# Patient Record
Sex: Female | Born: 1980 | Race: White | Hispanic: No | Marital: Married | Smoking: Former smoker
Health system: Southern US, Community
[De-identification: ages and names within clinical notes are randomized; demographics above are authoritative.]

## PROBLEM LIST (undated history)

## (undated) DIAGNOSIS — Z87898 Personal history of other specified conditions: Secondary | ICD-10-CM

## (undated) DIAGNOSIS — K3184 Gastroparesis: Secondary | ICD-10-CM

## (undated) DIAGNOSIS — R87629 Unspecified abnormal cytological findings in specimens from vagina: Secondary | ICD-10-CM

## (undated) DIAGNOSIS — K589 Irritable bowel syndrome without diarrhea: Secondary | ICD-10-CM

## (undated) DIAGNOSIS — K559 Vascular disorder of intestine, unspecified: Secondary | ICD-10-CM

## (undated) HISTORY — PX: COLPOSCOPY: SHX161

## (undated) HISTORY — DX: Vascular disorder of intestine, unspecified: K55.9

## (undated) HISTORY — PX: COLONOSCOPY: SHX174

## (undated) HISTORY — PX: WISDOM TOOTH EXTRACTION: SHX21

## (undated) HISTORY — PX: LYMPH NODE DISSECTION: SHX5087

## (undated) HISTORY — DX: Personal history of other specified conditions: Z87.898

## (undated) HISTORY — PX: DRUG INDUCED ENDOSCOPY: SHX6808

## (undated) HISTORY — DX: Irritable bowel syndrome, unspecified: K58.9

## (undated) HISTORY — DX: Gastroparesis: K31.84

---

## 2005-04-07 ENCOUNTER — Other Ambulatory Visit: Admission: RE | Admit: 2005-04-07 | Discharge: 2005-04-07 | Payer: Self-pay | Admitting: Obstetrics and Gynecology

## 2005-10-15 ENCOUNTER — Other Ambulatory Visit: Admission: RE | Admit: 2005-10-15 | Discharge: 2005-10-15 | Payer: Self-pay | Admitting: Obstetrics and Gynecology

## 2005-12-08 ENCOUNTER — Ambulatory Visit: Payer: Self-pay | Admitting: Internal Medicine

## 2006-04-02 ENCOUNTER — Encounter (INDEPENDENT_AMBULATORY_CARE_PROVIDER_SITE_OTHER): Payer: Self-pay | Admitting: Specialist

## 2006-04-02 ENCOUNTER — Ambulatory Visit: Payer: Self-pay | Admitting: Internal Medicine

## 2006-04-09 ENCOUNTER — Other Ambulatory Visit: Admission: RE | Admit: 2006-04-09 | Discharge: 2006-04-09 | Payer: Self-pay | Admitting: Obstetrics & Gynecology

## 2007-04-29 ENCOUNTER — Other Ambulatory Visit: Admission: RE | Admit: 2007-04-29 | Discharge: 2007-04-29 | Payer: Self-pay | Admitting: Obstetrics and Gynecology

## 2007-08-29 ENCOUNTER — Emergency Department (HOSPITAL_COMMUNITY): Admission: EM | Admit: 2007-08-29 | Discharge: 2007-08-29 | Payer: Self-pay | Admitting: Emergency Medicine

## 2007-08-30 ENCOUNTER — Ambulatory Visit: Payer: Self-pay | Admitting: Internal Medicine

## 2007-09-17 ENCOUNTER — Ambulatory Visit: Payer: Self-pay | Admitting: Internal Medicine

## 2007-09-17 LAB — CONVERTED CEMR LAB
BUN: 4 mg/dL — ABNORMAL LOW (ref 6–23)
CO2: 28 meq/L (ref 19–32)
Calcium: 9.2 mg/dL (ref 8.4–10.5)
Chloride: 106 meq/L (ref 96–112)
Creatinine, Ser: 0.6 mg/dL (ref 0.4–1.2)
GFR calc non Af Amer: 129 mL/min
Sed Rate: 7 mm/hr (ref 0–25)
Total Bilirubin: 0.5 mg/dL (ref 0.3–1.2)

## 2007-09-21 ENCOUNTER — Encounter: Payer: Self-pay | Admitting: Internal Medicine

## 2007-09-30 ENCOUNTER — Ambulatory Visit: Payer: Self-pay | Admitting: Internal Medicine

## 2007-09-30 ENCOUNTER — Encounter: Payer: Self-pay | Admitting: Internal Medicine

## 2007-10-06 ENCOUNTER — Encounter: Payer: Self-pay | Admitting: Internal Medicine

## 2007-10-06 ENCOUNTER — Ambulatory Visit: Payer: Self-pay | Admitting: Internal Medicine

## 2007-10-21 ENCOUNTER — Ambulatory Visit: Payer: Self-pay | Admitting: Cardiovascular Disease

## 2007-10-25 ENCOUNTER — Ambulatory Visit: Payer: Self-pay | Admitting: Internal Medicine

## 2007-10-28 ENCOUNTER — Ambulatory Visit (HOSPITAL_COMMUNITY): Admission: RE | Admit: 2007-10-28 | Discharge: 2007-10-28 | Payer: Self-pay | Admitting: Internal Medicine

## 2007-11-02 ENCOUNTER — Encounter: Payer: Self-pay | Admitting: Internal Medicine

## 2007-11-02 LAB — CONVERTED CEMR LAB: 5-HIAA, 24 Hr Urine: 3.8 mg/(24.h) (ref <=6.0)

## 2007-11-09 ENCOUNTER — Emergency Department (HOSPITAL_COMMUNITY): Admission: EM | Admit: 2007-11-09 | Discharge: 2007-11-09 | Payer: Self-pay | Admitting: Emergency Medicine

## 2008-01-11 DIAGNOSIS — K3184 Gastroparesis: Secondary | ICD-10-CM

## 2008-01-11 DIAGNOSIS — Z8719 Personal history of other diseases of the digestive system: Secondary | ICD-10-CM

## 2008-01-11 DIAGNOSIS — K589 Irritable bowel syndrome without diarrhea: Secondary | ICD-10-CM

## 2008-04-17 ENCOUNTER — Telehealth: Payer: Self-pay | Admitting: Internal Medicine

## 2008-09-18 ENCOUNTER — Telehealth: Payer: Self-pay | Admitting: Internal Medicine

## 2008-09-21 ENCOUNTER — Telehealth: Payer: Self-pay | Admitting: Internal Medicine

## 2008-10-26 ENCOUNTER — Telehealth: Payer: Self-pay | Admitting: Internal Medicine

## 2009-01-24 ENCOUNTER — Ambulatory Visit: Payer: Self-pay | Admitting: Internal Medicine

## 2009-02-09 ENCOUNTER — Ambulatory Visit (HOSPITAL_COMMUNITY): Admission: RE | Admit: 2009-02-09 | Discharge: 2009-02-09 | Payer: Self-pay | Admitting: Family Medicine

## 2009-10-08 ENCOUNTER — Telehealth: Payer: Self-pay | Admitting: Internal Medicine

## 2009-10-30 ENCOUNTER — Telehealth: Payer: Self-pay | Admitting: Internal Medicine

## 2009-11-06 ENCOUNTER — Encounter: Payer: Self-pay | Admitting: Internal Medicine

## 2011-04-01 NOTE — Assessment & Plan Note (Signed)
[facility]                                 ON-CALL NOTE   ALFRETTA, PINCH                      MRN:          [id number]  DATE:08/29/2007                            DOB:          01/16/1931    Lauren Goodwin called complaining of rectal bleeding. She has a history of  gastroparesis. She had painful crampy lower abdominal pain. This was  followed by straining and a bowel movement that was watery. She saw  small amounts of blood mixed with the stools. This happened on a second  occasion as well. The blood was minimal. She has no history of rectal  bleeding. Crampy abdominal pain is still present, though not as severe.   I instructed Lauren Goodwin to call back if she develops frank rectal  bleeding or melena. I told her that this was probably due to  hemorrhoidal bleeding and does not represent an acute severe GI bleed at  the present time.     Barbette Hair. Arlyce Dice, MD,FACG  Electronically Signed    RDK/MedQ  DD: 08/29/2007  DT: 08/29/2007  Job #: [id number]   cc:   Hedwig Morton. Juanda Chance, MD

## 2011-04-01 NOTE — Assessment & Plan Note (Signed)
[facility]                         GASTROENTEROLOGY OFFICE NOTE   Lauren Goodwin                    MRN:          [id number]  DATE:09/17/2007                            DOB:          [birth year]    Lauren Goodwin is a 30 year old white female, who has had intermittent  abdominal pain now for several years.  She has been fully evaluated in  another institution and moved down to [place] about two years ago.  She has been diagnosed with irritable bowel syndrome, but since we have  seen her, she has continued to have abdominal pain and, most recently,  was seen in the emergency room with acute rectal bleeding and abdominal  pain, abnormal CT of the abdomen, confirming presence of ischemic  colitis, involving left colon.  This was two weeks ago.  Patient was put  on bowel rest and antispasmodics.  She somewhat improved, but has still  recurrence of abdominal pain within two to three hours of eating,  sometimes immediately.  She has to eat only small amounts and she has  lost weight from 135 pounds on August 30, 2007, to currently 128.8  pounds today.  She is having diarrhea and constipation.  There has been  no fever and no further bleeding.   Her previous GI workup in [place] in 2003 included small bowel follow-  through, gastric emptying scan, upper endoscopy and colonoscopy.  She  was treated with MiraLax and Zelnorm, but never really did well on it.  She is currently followed at [facility] at Department of Gynecology  for dysplastic cervical biopsies.  She was referred by Rock Nephew.  The  patient's mother had history of endometriosis.  Patient herself has some  pain with her periods and has been on birth control pills for a period  of time.   MEDICATIONS:  1. Birth control pills  2. Levbid 0.375 mg one p.o. b.i.d.  3. Vicodin 5/500 p.r.n. abdominal pain.   PHYSICAL EXAM:  Blood pressure 100/64, pulse 72 and weight 128.8 pounds.  She appeared healthy, but thinner than on last exam.  LUNGS:  Clear to auscultation.  COR:  With normal S1, normal S2.  ABDOMEN:  Soft, relaxed, with tenderness in periumbilical area.  Left  and right lower quadrants are not tender and there was no distention.  Bowel sounds were somewhat hyperactive.  RECTAL EXAM:  With normal rectal tone, small amount of Hemoccult-  negative stool.   IMPRESSION:  Patient is a 30 year old white female with ongoing chronic  abdominal pain with acute exacerbation, recent evidence of ischemic  colitis on CT scan of the abdomen two weeks ago.  Patient continues to  have pain with eating and has resulting weight-loss.  I am concerned  about the severity of her symptoms and possible occult GI lesions that  we have not figured out yet.  For instance, Crohn's disease of the small  bowel, or possibly endometriosis, affecting her bowel.   PLAN:  1. Small bowel capsule endoscopy to rule out Crohn's disease of the      small bowel.  2. Continue Levbid 0.375 mg  p.o. b.i.d.  3. IBD markers.  4. Prescription for Vicodin 5/500 dispense #30 p.r.n. severe abdominal      pain.  5. I would like to ask Rock Nephew if she could assess patient as to      the possibility of endometriosis.     Hedwig Morton. Juanda Chance, MD  Electronically Signed    DMB/MedQ  DD: 09/17/2007  DT: 09/17/2007  Job #: [id number]   cc:   Kirk Ruths, M.D.  [place], [place] Rock Nephew, NP, [address]

## 2011-04-01 NOTE — Assessment & Plan Note (Signed)
[facility]                         GASTROENTEROLOGY OFFICE NOTE   HANAKO, TIPPING                    MRN:          [id number]  DATE:08/30/2007                            DOB:          [birth year]    Ms. Lemar Livings is a 30 year old white female seen today on an add-on basis  after being seen at the emergency room in [facility] last night  with acute abdominal pain and rectal bleeding.  Two days ago, on  Saturday, she was cleaning her house and drinking some wine, when she  developed crampy abdominal pain.  Her cramps were more severe than  usual.  We have seen her in the past for irritable bowel  syndrome/constipation and history of gastroparesis.  She was evaluated  for it in 2003 and had her last colonoscopy in May 2007.  The pain  persisted and in the evening she had a bowel movement with blood in it.  In the emergency room at [facility], her vital signs were  stable.  All her blood tests were normal but a CT scan of her abdomen  showed no specific thickening of the left colon toward the splenic  flexure consistent with ischemic colitis.  Since yesterday, she has  taken half of the Vicodin 5/500, every about 2-3 hours.  She is feeling  somewhat better.  The patient has stayed on clear liquids.  There has  been no fever and no further rectal bleeding.   MEDICATIONS:  1. MiraLax 17 grams 2-3 times a week.  She has not taken any.  2. Birth control pills daily for past 6 years.  3. Levbid 0.375 mg b.i.d.  The patient really has not taken it      recently.   PHYSICAL EXAMINATION:  VITAL SIGNS:  Blood pressure 118/60, pulse 60,  and weight 134 pounds.  Usual weight 135 pounds.  GENERAL:  She was alert and oriented in no distress.  SKIN:  Warm and dry.  HEENT:  Sclerae was nonicteric.  Oral cavity was normal.  NECK:  Supple.  No adenopathy.  LUNGS:  Clear to auscultation.  COR:  Quiet precordium.  Normal S1, normal S2.  ABDOMEN:   Soft with marked tenderness in the splenic flexure and the  left middle quadrant.  Normoactive bowel sounds.  No tympani.  Somewhat  hyperactive bowel sounds in the lower quadrant.  RECTAL:  With normal rectal tone.  No stool.  Mucus was heme negative.   IMPRESSION:  A 30 year old white female with ischemic colitis rather  stable.  The bleeding occurred more than 24 hours ago.  Since then the  patient has done somewhat better.  The etiology is not clear.  She has a  history of irritable bowel syndrome/constipation.  She is also on birth  control pills which could contribute to the ischemic bowel.  She took  ibuprofen but I doubt that would lead to ischemic colitis.  Other  possibilities is that she had some sort of low  blood flow through the  bowel either due to dehydration or vasodilatation from alcohol or  possibly due to baseline  low blood pressure.   PLAN:  1. I do not feel that she needs to be hospitalized because she is      already somewhat better.  2. She will stay on clear liquids for the next 24-48 hours and go      slowly to full liquids for the next 3-4 days, then to a regular      diet with low residue modifications.  3. Levbid 0.375 mg, take one every morning.  4. Continue to take hydrocodone as needed.  I suspect she may need it      for another 24-48 hours.  5. We discussed discontinuation of birth control pills.  She will see      her gynecologist, Shirlyn Goltz, from [facility] to      discuss different options for birth control.  6. I do not need to see her unless she continues to have problems.     Hedwig Morton. Juanda Chance, MD  Electronically Signed    DMB/MedQ  DD: 08/30/2007  DT: 08/30/2007  Job #: [id number]

## 2011-06-20 ENCOUNTER — Other Ambulatory Visit (HOSPITAL_COMMUNITY): Payer: Self-pay | Admitting: Internal Medicine

## 2011-06-20 ENCOUNTER — Ambulatory Visit (HOSPITAL_COMMUNITY)
Admission: RE | Admit: 2011-06-20 | Discharge: 2011-06-20 | Disposition: A | Discharge status: Home / Self Care | Payer: BC Managed Care – PPO | Source: Ambulatory Visit | Attending: Internal Medicine | Admitting: Internal Medicine

## 2011-06-20 DIAGNOSIS — R1931 Right upper quadrant abdominal rigidity: Secondary | ICD-10-CM

## 2011-06-20 DIAGNOSIS — R1011 Right upper quadrant pain: Secondary | ICD-10-CM | POA: Insufficient documentation

## 2011-08-22 LAB — URINALYSIS, ROUTINE W REFLEX MICROSCOPIC
Glucose, UA: NEGATIVE
pH: 7.5

## 2011-08-22 LAB — CBC
HCT: 37
Hemoglobin: 12.8
MCHC: 34.5
MCV: 87.9
RBC: 4.2
RDW: 12
WBC: 7.7

## 2011-08-22 LAB — BASIC METABOLIC PANEL
BUN: 4 — ABNORMAL LOW
Chloride: 103
Creatinine, Ser: 0.64
GFR calc non Af Amer: >60
Glucose, Bld: 114 — ABNORMAL HIGH
Sodium: 137

## 2011-08-22 LAB — HEPATIC FUNCTION PANEL
AST: 22
Albumin: 3.5
Alkaline Phosphatase: 34 — ABNORMAL LOW
Indirect Bilirubin: 0.7
Total Protein: 6.5

## 2011-08-22 LAB — DIFFERENTIAL
Basophils Relative: 0
Neutrophils Relative %: 71

## 2011-08-28 LAB — PREGNANCY, URINE: Preg Test, Ur: NEGATIVE

## 2011-08-28 LAB — COMPREHENSIVE METABOLIC PANEL
Alkaline Phosphatase: 26 — ABNORMAL LOW
BUN: 8
CO2: 24
Calcium: 9.1
GFR calc Af Amer: >60
GFR calc non Af Amer: >60
Glucose, Bld: 112 — ABNORMAL HIGH
Potassium: 3.9
Sodium: 136
Total Bilirubin: 0.6
Total Protein: 6.9

## 2011-08-28 LAB — URINALYSIS, ROUTINE W REFLEX MICROSCOPIC
Hgb urine dipstick: NEGATIVE
Ketones, ur: 40 — AB
Protein, ur: NEGATIVE
Specific Gravity, Urine: 1.015
Urobilinogen, UA: 0.2
pH: 5

## 2011-08-28 LAB — CBC
MCHC: 34.7
MCV: 86.4
WBC: 8.6

## 2011-08-28 LAB — LIPASE, BLOOD: Lipase: 18

## 2011-08-28 LAB — DIFFERENTIAL
Lymphocytes Relative: 14
Neutro Abs: 6.9

## 2011-10-21 ENCOUNTER — Encounter: Payer: Self-pay | Admitting: *Deleted

## 2011-10-27 ENCOUNTER — Ambulatory Visit (INDEPENDENT_AMBULATORY_CARE_PROVIDER_SITE_OTHER): Payer: BC Managed Care – PPO | Admitting: Internal Medicine

## 2011-10-27 ENCOUNTER — Encounter: Payer: Self-pay | Admitting: Internal Medicine

## 2011-10-27 VITALS — BP 100/68 | HR 60 | Ht 65.0 in | Wt 140.0 lb

## 2011-10-27 DIAGNOSIS — K589 Irritable bowel syndrome without diarrhea: Secondary | ICD-10-CM

## 2011-10-27 MED ORDER — HYDROCODONE-ACETAMINOPHEN 5-500 MG PO TABS: 1.0000 | ORAL_TABLET | Freq: Four times a day (QID) | ORAL | Status: DC | PRN

## 2011-10-27 NOTE — Patient Instructions (Signed)
We have sent the following medications to your pharmacy for you to pick up at your convenience: Hydrocodone CC: Dr Karleen Hampshire

## 2011-10-27 NOTE — Progress Notes (Signed)
Lauren Goodwin Apr 13, 1981 MRN [id number]    History of Present Illness:  This is a 30 year old white female with irritable bowel syndrome who underwent an extensive GI workup in 2008 for acute abdominal pain and ischemic colitis on a colonoscopy in 2008. The last office visit was in March 2010. Her abdominal pain has stabilized to where the attacks occur about once a month and usually are relieved with levbid and hydrocodone. She needs a refill on both prescriptions. Her small bowel capsule endoscopy in 2008 showed a segment of ulcerated mucosa and the small bowel. The capsule was retained at that point. A CT scan of the abdomen showed a thickened left colon consistent with ischemic colitis.   Past Medical History  Diagnosis Date  . Colitis, ischemic   . Gastroparesis   . IBS (irritable bowel syndrome)    Past Surgical History  Procedure Date  . Lymph node dissection     reports that she has quit smoking. She has never used smokeless tobacco. She reports that she drinks alcohol. She reports that she does not use illicit drugs. family history includes Heart disease in an unspecified family member.  There is no history of Colon cancer. No Known Allergies      Review of Systems: Denies heartburn chest pain shortness of breath positive for abdominal pain as described above  The remainder of the 10 point ROS is negative except as outlined in H&P   Physical Exam: General appearance  Well developed, in no distress. Eyes- non icteric. HEENT nontraumatic, normocephalic. Mouth no lesions, tongue papillated, no cheilosis. Neck supple without adenopathy, thyroid not enlarged, no carotid bruits, no JVD. Lungs Clear to auscultation bilaterally. Cor normal S1, normal S2, regular rhythm, no murmur,  quiet precordium. Abdomen: Soft with normal active bowel sounds. Mild tenderness in right lower quadrant of the cecum. No mass or fullness. The rest of the abdominal exam was  unremarkable. Rectal: Noted Extremities no pedal edema. Skin no lesions. Neurological alert and oriented x 3. Psychological normal mood and affect.  Assessment and Plan:  Problem #1 Intermittent abdominal pain. Patient is status post extensive evaluation. Her symptoms are consistent with irritable bowel syndrome although Crohn's disease could never be ruled out. She has been able to gain some weight back and has responded to oral antispasmodics. Her disease has not progressed. We will obtain her lab results from Lauren Goodwin which were done recently. She is trying to get pregnant and will let us know if there are any GI problems during her pregnancy.   10/27/2011 Lauren Goodwin

## 2012-07-12 ENCOUNTER — Other Ambulatory Visit: Payer: Self-pay | Admitting: Internal Medicine

## 2012-07-15 ENCOUNTER — Telehealth: Payer: Self-pay | Admitting: Internal Medicine

## 2012-07-15 MED ORDER — HYDROCODONE-ACETAMINOPHEN 5-500 MG PO TABS: 1.0000 | ORAL_TABLET | Freq: Four times a day (QID) | ORAL | Status: AC | PRN

## 2012-07-15 NOTE — Telephone Encounter (Signed)
1 month rx sent. 

## 2014-09-20 ENCOUNTER — Other Ambulatory Visit: Payer: Self-pay | Admitting: Obstetrics and Gynecology

## 2014-09-21 LAB — CYTOLOGY - PAP

## 2014-09-27 ENCOUNTER — Telehealth: Payer: Self-pay | Admitting: Internal Medicine

## 2014-10-26 NOTE — Telephone Encounter (Signed)
A user error has taken place.

## 2014-11-15 ENCOUNTER — Ambulatory Visit (INDEPENDENT_AMBULATORY_CARE_PROVIDER_SITE_OTHER): Payer: Managed Care, Other (non HMO) | Admitting: Internal Medicine

## 2014-11-15 ENCOUNTER — Encounter: Payer: Self-pay | Admitting: Internal Medicine

## 2014-11-15 VITALS — BP 110/60 | HR 64 | Ht 65.0 in | Wt 139.0 lb

## 2014-11-15 DIAGNOSIS — K589 Irritable bowel syndrome without diarrhea: Secondary | ICD-10-CM

## 2014-11-15 MED ORDER — HYOSCYAMINE SULFATE ER 0.375 MG PO TB12: 0.3750 mg | ORAL_TABLET | Freq: Two times a day (BID) | ORAL | Status: DC

## 2014-11-15 MED ORDER — HYDROCODONE-ACETAMINOPHEN 5-325 MG PO TABS: 1.0000 | ORAL_TABLET | Freq: Four times a day (QID) | ORAL | Status: DC | PRN

## 2014-11-15 NOTE — Patient Instructions (Signed)
We have sent the following medications to your pharmacy for you to pick up at your convenience: Levbid  We have given you a prescription for hydrocodone to take to your pharmacy.  Please follow up with Dr Juanda ChanceBrodie in 2 years.  Cc:Dr Karleen HampshireWilliam McGough

## 2014-11-15 NOTE — Progress Notes (Signed)
Lauren Goodwin 12/29/1980 [id number]  Note: This dictation was prepared with Dragon digital system. Any transcriptional errors that result from this procedure are unintentional.   History of Present Illness:  This is a 33 year old white female with chronic intermittent abdominal pain and history of ischemic colitis in March 2010. Her last appointment was in December 2012 for irritable bowel syndrome. She underwent extensive GI evaluation in 2008 which included small bowel capsule endoscopy, which revealed ulcerated mucosa in the small bowel. A CT scan of the abdomen at that time showed thickening of the left colon. A colonoscopy in 2003 showed nonspecific terminal ileitis. A repeat in 2007 was normal. An upper endoscopy in 2008 was normal. His most recent CT scan of the abdomen in August 2012 was normal. She comes today to refill Levbid and hydrocodone. She has had only 3-4 attacks in the last year. The attacks usually start with lower abdominal pain cramps followed by diarrhea. She has learned how to handle it by taking one hydrocodone, Levbid and  Bowl rest. The attacks seem to be brought on by stress. She has learnt to modify her diet to avoid fried foods and large heavy meals.    Past Medical History  Diagnosis Date  . Colitis, ischemic   . Gastroparesis   . IBS (irritable bowel syndrome)     Past Surgical History  Procedure Laterality Date  . Lymph node dissection      No Known Allergies  Family history and social history have been reviewed.  Review of Systems: Denies rectal bleeding. Mild constipation. Occasional vomiting  The remainder of the 10 point ROS is negative except as outlined in the H&P  Physical Exam: General Appearance Well developed, in no distress Eyes  Non icteric  HEENT  Non traumatic, normocephalic  Mouth No lesion, tongue papillated, no cheilosis Neck Supple without adenopathy, thyroid not enlarged, no carotid bruits, no JVD Lungs Clear to  auscultation bilaterally COR Normal S1, normal S2, regular rhythm, no murmur, quiet precordium Abdomen soft nontender abdomen with normoactive bowel sounds. No distention Rectal not done Extremities  No pedal edema Skin No lesions Neurological Alert and oriented x 3 Psychological Normal mood and affect  Assessment and Plan:   Problem #301 33 year old white female with irritable bowel syndrome who has episodes  of abdominal pain consistent with gastrocolic reflex. She had one episode of ischemic colitis in 2008.. Inflammatory bowel disease is not likely. She is doing reasonably well taking Levbid and on a rare occasion Vicodin. We will refill her medications. We will see her when necessary.    Lauren SarDora Kamora Goodwin 11/15/2014

## 2014-12-14 ENCOUNTER — Other Ambulatory Visit (HOSPITAL_COMMUNITY): Payer: Self-pay | Admitting: Physician Assistant

## 2014-12-14 DIAGNOSIS — R0789 Other chest pain: Secondary | ICD-10-CM

## 2014-12-14 DIAGNOSIS — M940 Chondrocostal junction syndrome [Tietze]: Secondary | ICD-10-CM

## 2014-12-20 ENCOUNTER — Other Ambulatory Visit (HOSPITAL_COMMUNITY): Payer: Self-pay | Admitting: Physician Assistant

## 2014-12-20 ENCOUNTER — Ambulatory Visit (HOSPITAL_COMMUNITY)
Admission: RE | Admit: 2014-12-20 | Discharge: 2014-12-20 | Disposition: A | Discharge status: Home / Self Care | Payer: Managed Care, Other (non HMO) | Source: Ambulatory Visit | Attending: Physician Assistant | Admitting: Physician Assistant

## 2014-12-20 DIAGNOSIS — R0789 Other chest pain: Secondary | ICD-10-CM

## 2014-12-20 DIAGNOSIS — M940 Chondrocostal junction syndrome [Tietze]: Secondary | ICD-10-CM

## 2014-12-20 DIAGNOSIS — R079 Chest pain, unspecified: Secondary | ICD-10-CM | POA: Insufficient documentation

## 2015-03-15 ENCOUNTER — Telehealth: Payer: Self-pay | Admitting: Internal Medicine

## 2015-03-15 NOTE — Telephone Encounter (Signed)
Received [facility] sent to Dr. Juanda ChanceBrodie. 03/15/15/ss

## 2015-04-24 ENCOUNTER — Ambulatory Visit (INDEPENDENT_AMBULATORY_CARE_PROVIDER_SITE_OTHER): Payer: Managed Care, Other (non HMO) | Admitting: Internal Medicine

## 2015-04-24 ENCOUNTER — Encounter: Payer: Self-pay | Admitting: Internal Medicine

## 2015-04-24 VITALS — BP 100/68 | HR 60 | Ht 65.0 in | Wt 137.2 lb

## 2015-04-24 DIAGNOSIS — R079 Chest pain, unspecified: Secondary | ICD-10-CM | POA: Diagnosis not present

## 2015-04-24 NOTE — Patient Instructions (Addendum)
Your physician has requested that you go to the basement for the following lab work before leaving today:H. Pylori stool antigen.   You have been scheduled for a Barium Esophogram at [facility] (1st floor of the hospital) on 05/02/15 at 10:30am. Please arrive 15 minutes prior to your appointment for registration. Make certain not to have anything to eat or drink 4 hours prior to your test. If you need to reschedule for any reason, please contact radiology at [phone] to do so. __________________________________________________________________ A barium swallow is an examination that concentrates on views of the esophagus. This tends to be a double contrast exam (barium and two liquids which, when combined, create a gas to distend the wall of the oesophagus) or single contrast (non-ionic iodine based). The study is usually tailored to your symptoms so a good history is essential. Attention is paid during the study to the form, structure and configuration of the esophagus, looking for functional disorders (such as aspiration, dysphagia, achalasia, motility and reflux) EXAMINATION You may be asked to change into a gown, depending on the type of swallow being performed. A radiologist and radiographer will perform the procedure. The radiologist will advise you of the type of contrast selected for your procedure and direct you during the exam. You will be asked to stand, sit or lie in several different positions and to hold a small amount of fluid in your mouth before being asked to swallow while the imaging is performed .In some instances you may be asked to swallow barium coated marshmallows to assess the motility of a solid food bolus. The exam can be recorded as a digital or video fluoroscopy procedure. POST PROCEDURE It will take 1-2 days for the barium to pass through your system. To facilitate this, it is important, unless otherwise directed, to increase your fluids for the next 24-48hrs and  to resume your normal diet.  This test typically takes about 30 minutes to perform. __________________________________________________________________________________ Dwyane LuoBen Mann PA

## 2015-04-24 NOTE — Progress Notes (Signed)
Lauren Goodwin 03/05/1981 [id number]  Note: This dictation was prepared with Dragon digital system. Any transcriptional errors that result from this procedure are unintentional.   History of Present Illness: This is a 34 year-old white female with history of irritable bowel syndrome and question of inflammatory bowel disease. She is here today top evaluate  noncardiac chest pain which started last 4 2015 and was quite intense in the winter during January and February 2016. It has subsided significantly at this point. It occurs randomly in no relationship to position breathing eating or movement. It was completely relieved by ibuprofen 800 mg 3 times a day but unfortunately the upper fold often had gastric intolerance she had to stop it. She was then put on omeprazole 20 mg daily which seemed to help some but she developed urinary tract infection which she associated with the omeprazole. . Pylori antibody was positive so she was scheduled for  [employer] breath test but had to be off PPI's before being able to schedule it.. She is currently not taking any medication and the pain is  very weak and lasts only several seconds at a time She says it is down to 30%.. She is quite concerned about it and asks  to have it  evaluated. She had an extensive GI workup in 2008 and 2010 for irritable bowel syndrome/ suspected inflammatory bowel disease. Most recent CT scan of the abdomen in August 2012 was normal. Upper endoscopy in 2008 was normal.     Past Medical History  Diagnosis Date  . Colitis, ischemic   . Gastroparesis   . IBS (irritable bowel syndrome)     Past Surgical History  Procedure Laterality Date  . Lymph node dissection      No Known Allergies  Family history and social history have been reviewed.  Review of Systems: Chest pain as described above  The remainder of the 10 point ROS is negative except as outlined in the H&P  Physical Exam: General Appearance Well developed, in no  distress Eyes  Non icteric  HEENT  Non traumatic, normocephalic  Mouth No lesion, tongue papillated, no cheilosis Neck Supple without adenopathy, thyroid not enlarged, no carotid bruits, no JVD Lungs Clear to auscultation bilaterally, costochondral junctions are not tender, no rub on inspiration COR Normal S1, normal S2, regular rhythm, no murmur, quiet precordium Abdomen soft, nontender. No distention. Rectal not done Extremities  No pedal edema Skin No lesions Neurological Alert and oriented x 3 Psychological Normal mood and affect  Assessment and Plan:   34 year old white female with the vague substernal chest pain which has subsided significantly since its onset last fall 2015. She has tried anti-inflammatory agents as well as acid reducing agents. There is no associated dysphagia. I think she has esophageal spasm. We will proceed with barium esophagram and  if negative we will proceed with upper endoscopy. We will be checking stool for H. Pylori in place of a breath test. Depending on the results of the barium study we may do upper endoscopy. We will start an anti- spasmodic  Levsin sublingually 0.125. She is currently on Levbid.Marland Kitchen.375 when necessary irritable bowel syndrome    Lauren Goodwin 04/24/2015

## 2015-04-30 ENCOUNTER — Other Ambulatory Visit: Payer: Managed Care, Other (non HMO)

## 2015-04-30 DIAGNOSIS — R079 Chest pain, unspecified: Secondary | ICD-10-CM

## 2015-05-01 LAB — HELICOBACTER PYLORI  SPECIAL ANTIGEN: H. PYLORI Antigen: NEGATIVE

## 2015-05-02 ENCOUNTER — Other Ambulatory Visit: Payer: Self-pay | Admitting: *Deleted

## 2015-05-02 ENCOUNTER — Ambulatory Visit (HOSPITAL_COMMUNITY)
Admission: RE | Admit: 2015-05-02 | Discharge: 2015-05-02 | Disposition: A | Discharge status: Home / Self Care | Payer: Managed Care, Other (non HMO) | Source: Ambulatory Visit | Attending: Internal Medicine | Admitting: Internal Medicine

## 2015-05-02 DIAGNOSIS — R079 Chest pain, unspecified: Secondary | ICD-10-CM | POA: Insufficient documentation

## 2015-05-07 ENCOUNTER — Ambulatory Visit (AMBULATORY_SURGERY_CENTER): Payer: Self-pay | Admitting: *Deleted

## 2015-05-07 VITALS — Ht 66.0 in | Wt 137.2 lb

## 2015-05-07 DIAGNOSIS — R079 Chest pain, unspecified: Secondary | ICD-10-CM

## 2015-05-07 NOTE — Progress Notes (Signed)
No home 02 use No issues with past sedation No egg or soy allergy No diet pills emmi declined

## 2015-05-09 ENCOUNTER — Encounter: Payer: Self-pay | Admitting: Internal Medicine

## 2015-05-09 ENCOUNTER — Ambulatory Visit (AMBULATORY_SURGERY_CENTER): Payer: Managed Care, Other (non HMO) | Admitting: Internal Medicine

## 2015-05-09 VITALS — BP 112/70 | HR 59 | Temp 98.5°F | Resp 30 | Ht 65.0 in | Wt 137.0 lb

## 2015-05-09 DIAGNOSIS — R079 Chest pain, unspecified: Secondary | ICD-10-CM | POA: Diagnosis present

## 2015-05-09 DIAGNOSIS — R0789 Other chest pain: Secondary | ICD-10-CM

## 2015-05-09 DIAGNOSIS — K2950 Unspecified chronic gastritis without bleeding: Secondary | ICD-10-CM | POA: Diagnosis not present

## 2015-05-09 MED ORDER — HYOSCYAMINE SULFATE 0.125 MG SL SUBL: 0.1250 mg | SUBLINGUAL_TABLET | SUBLINGUAL | Status: DC | PRN

## 2015-05-09 MED ORDER — SODIUM CHLORIDE 0.9 % IV SOLN: 500.0000 mL | INTRAVENOUS | Status: DC

## 2015-05-09 NOTE — Progress Notes (Signed)
Called to room to assist during endoscopic procedure.  Patient ID and intended procedure confirmed with present staff. Received instructions for my participation in the procedure from the performing physician.  

## 2015-05-09 NOTE — Patient Instructions (Signed)
YOU HAD AN ENDOSCOPIC PROCEDURE TODAY AT THE [facility]:   Refer to the procedure report that was given to you for any specific questions about what was found during the examination.  If the procedure report does not answer your questions, please call your gastroenterologist to clarify.  If you requested that your care partner not be given the details of your procedure findings, then the procedure report has been included in a sealed envelope for you to review at your convenience later.  YOU SHOULD EXPECT: Some feelings of bloating in the abdomen. Passage of more gas than usual.  Walking can help get rid of the air that was put into your GI tract during the procedure and reduce the bloating. If you had a lower endoscopy (such as a colonoscopy or flexible sigmoidoscopy) you may notice spotting of blood in your stool or on the toilet paper. If you underwent a bowel prep for your procedure, you may not have a normal bowel movement for a few days.  Please Note:  You might notice some irritation and congestion in your nose or some drainage.  This is from the oxygen used during your procedure.  There is no need for concern and it should clear up in a day or so.  SYMPTOMS TO REPORT IMMEDIATELY:   Following lower endoscopy (colonoscopy or flexible sigmoidoscopy):  Excessive amounts of blood in the stool  Significant tenderness or worsening of abdominal pains  Swelling of the abdomen that is new, acute  Fever of 100F or higher   Following upper endoscopy (EGD)  Vomiting of blood or coffee ground material  New chest pain or pain under the shoulder blades  Painful or persistently difficult swallowing  New shortness of breath  Fever of 100F or higher  Black, tarry-looking stools  For urgent or emergent issues, a gastroenterologist can be reached at any hour by calling [phone].   DIET: Your first meal following the procedure should be a small meal and then it is ok to progress to  your normal diet. Heavy or fried foods are harder to digest and may make you feel nauseous or bloated.  Likewise, meals heavy in dairy and vegetables can increase bloating.  Drink plenty of fluids but you should avoid alcoholic beverages for 24 hours.  ACTIVITY:  You should plan to take it easy for the rest of today and you should NOT DRIVE or use heavy machinery until tomorrow (because of the sedation medicines used during the test).    FOLLOW UP: Our staff will call the number listed on your records the next business day following your procedure to check on you and address any questions or concerns that you may have regarding the information given to you following your procedure. If we do not reach you, we will leave a message.  However, if you are feeling well and you are not experiencing any problems, there is no need to return our call.  We will assume that you have returned to your regular daily activities without incident.  If any biopsies were taken you will be contacted by phone or by letter within the next 1-3 weeks.  Please call us at [phone] if you have not heard about the biopsies in 3 weeks.    SIGNATURES/CONFIDENTIALITY: You and/or your care partner have signed paperwork which will be entered into your electronic medical record.  These signatures attest to the fact that that the information above on your After Visit Summary has been reviewed   and is understood.  Full responsibility of the confidentiality of this discharge information lies with you and/or your care-partner.YOU HAD AN ENDOSCOPIC PROCEDURE TODAY AT [facility]:   Refer to the procedure report that was given to you for any specific questions about what was found during the examination.  If the procedure report does not answer your questions, please call your gastroenterologist to clarify.  If you requested that your care partner not be given the details of your procedure findings, then the procedure  report has been included in a sealed envelope for you to review at your convenience later.  YOU SHOULD EXPECT: Some feelings of bloating in the abdomen. Passage of more gas than usual.  Walking can help get rid of the air that was put into your GI tract during the procedure and reduce the bloating. If you had a lower endoscopy (such as a colonoscopy or flexible sigmoidoscopy) you may notice spotting of blood in your stool or on the toilet paper. If you underwent a bowel prep for your procedure, you may not have a normal bowel movement for a few days.  Please Note:  You might notice some irritation and congestion in your nose or some drainage.  This is from the oxygen used during your procedure.  There is no need for concern and it should clear up in a day or so.  SYMPTOMS TO REPORT IMMEDIATELY:   Following lower endoscopy (colonoscopy or flexible sigmoidoscopy):  Excessive amounts of blood in the stool  Significant tenderness or worsening of abdominal pains  Swelling of the abdomen that is new, acute  Fever of 100F or higher   Following upper endoscopy (EGD)  Vomiting of blood or coffee ground material  New chest pain or pain under the shoulder blades  Painful or persistently difficult swallowing  New shortness of breath  Fever of 100F or higher  Black, tarry-looking stools  For urgent or emergent issues, a gastroenterologist can be reached at any hour by calling [phone].   DIET: Your first meal following the procedure should be a small meal and then it is ok to progress to your normal diet. Heavy or fried foods are harder to digest and may make you feel nauseous or bloated.  Likewise, meals heavy in dairy and vegetables can increase bloating.  Drink plenty of fluids but you should avoid alcoholic beverages for 24 hours.  ACTIVITY:  You should plan to take it easy for the rest of today and you should NOT DRIVE or use heavy machinery until tomorrow (because of the sedation  medicines used during the test).    FOLLOW UP: Our staff will call the number listed on your records the next business day following your procedure to check on you and address any questions or concerns that you may have regarding the information given to you following your procedure. If we do not reach you, we will leave a message.  However, if you are feeling well and you are not experiencing any problems, there is no need to return our call.  We will assume that you have returned to your regular daily activities without incident.  If any biopsies were taken you will be contacted by phone or by letter within the next 1-3 weeks.  Please call us at [phone] if you have not heard about the biopsies in 3 weeks.    SIGNATURES/CONFIDENTIALITY: You and/or your care partner have signed paperwork which will be entered into your electronic medical record.  These signatures attest to the fact that that the information above on your After Visit Summary has been reviewed and is understood.  Full responsibility of the confidentiality of this discharge information lies with you and/or your care-partner.YOU HAD AN ENDOSCOPIC PROCEDURE TODAY AT THE [facility]:   Refer to the procedure report that was given to you for any specific questions about what was found during the examination.  If the procedure report does not answer your questions, please call your gastroenterologist to clarify.  If you requested that your care partner not be given the details of your procedure findings, then the procedure report has been included in a sealed envelope for you to review at your convenience later.  YOU SHOULD EXPECT: Some feelings of bloating in the abdomen. Passage of more gas than usual.  Walking can help get rid of the air that was put into your GI tract during the procedure and reduce the bloating. If you had a lower endoscopy (such as a colonoscopy or flexible sigmoidoscopy) you may notice spotting of blood  in your stool or on the toilet paper. If you underwent a bowel prep for your procedure, you may not have a normal bowel movement for a few days.  Please Note:  You might notice some irritation and congestion in your nose or some drainage.  This is from the oxygen used during your procedure.  There is no need for concern and it should clear up in a day or so.  SYMPTOMS TO REPORT IMMEDIATELY:     Following upper endoscopy (EGD)  Vomiting of blood or coffee ground material  New chest pain or pain under the shoulder blades  Painful or persistently difficult swallowing  New shortness of breath  Fever of 100F or higher  Black, tarry-looking stools  For urgent or emergent issues, a gastroenterologist can be reached at any hour by calling [phone].   DIET: Your first meal following the procedure should be a small meal and then it is ok to progress to your normal diet. Heavy or fried foods are harder to digest and may make you feel nauseous or bloated.  Likewise, meals heavy in dairy and vegetables can increase bloating.  Drink plenty of fluids but you should avoid alcoholic beverages for 24 hours.  ACTIVITY:  You should plan to take it easy for the rest of today and you should NOT DRIVE or use heavy machinery until tomorrow (because of the sedation medicines used during the test).    FOLLOW UP: Our staff will call the number listed on your records the next business day following your procedure to check on you and address any questions or concerns that you may have regarding the information given to you following your procedure. If we do not reach you, we will leave a message.  However, if you are feeling well and you are not experiencing any problems, there is no need to return our call.  We will assume that you have returned to your regular daily activities without incident.  If any biopsies were taken you will be contacted by phone or by letter within the next 1-3 weeks.  Please call us  at [phone] if you have not heard about the biopsies in 3 weeks.    SIGNATURES/CONFIDENTIALITY: You and/or your care partner have signed paperwork which will be entered into your electronic medical record.  These signatures attest to the fact that that the information above on your After Visit Summary has been reviewed  and is understood.  Full responsibility of the confidentiality of this discharge information lies with you and/or your care-partner.  Await biopsy results Try Levsin sublingually0.125mg . As needed for chest pain.

## 2015-05-09 NOTE — Op Note (Signed)
[facility] [address] [place], [address]   ENDOSCOPY PROCEDURE REPORT  PATIENT: Lauren Goodwin, Lauren Goodwin  MR#: [id number] BIRTHDATE: [birth year] , 34  yrs. old GENDER: female ENDOSCOPIST: Hart Carwin, MD REFERRED BY:  Karleen Hampshire, M.D. PROCEDURE DATE:  05/09/2015 PROCEDURE:  EGD w/ biopsy ASA CLASS:     Class II INDICATIONS:  Noncardiac chest pain.  Positive H.  pylori antibody. Negative stool test for H.  pylori.  Upper endoscopy in 2008 was normal.  Barium esophagram is normal.. MEDICATIONS: Monitored anesthesia care and Propofol 150 mg IV TOPICAL ANESTHETIC: none  DESCRIPTION OF PROCEDURE: After the risks benefits and alternatives of the procedure were thoroughly explained, informed consent was obtained.  The LB [id number] endoscope was introduced through the mouth and advanced to the second portion of the duodenum , Without limitations.  The instrument was slowly withdrawn as the mucosa was fully examined.    Esophagus: proximal, mid and distal esophageal mucosa was normal. Squamocolumnar junction appeared normal  . There was no hiatal hernia. There was no stricturestomach: gastric folds were unremarkable. Gastric antrum and pyloric outlet was normal. Retroflexion of the endoscope revealed normal fundus and cardia. Biopsies were taken from gastric antrum to rule out H. pylori  Duodenum:  duodenal bulb and descending duodenum was normal biopsies were obtained from descending duodenum to rule out sprue because of history of  colitis[         The scope was then withdrawn from the patient and the procedure completed.  COMPLICATIONS: There were no immediate complications.  ENDOSCOPIC IMPRESSION: Essentially normal upper endoscopy of esophagus, stomach and duodenum. Her white count for chest pain. Biopsies from gastric antrum to rule out H. pylori Biopsies from the descending duodenum to rule out villous atrophy  RECOMMENDATIONS: 1.   Await biopsy results 2.  I am not sure that her chest pain is GI related. We will try Levsin sublingually 0.125 mg when necessary chest pain   REPEAT EXAM: for EGD pending biopsy results.  eSigned:  Hart Carwin, MD 05/09/2015 8:20 AM    CC:

## 2015-05-09 NOTE — Progress Notes (Signed)
A/ox3 pleased with MAC, report to Jane RN 

## 2015-05-10 ENCOUNTER — Telehealth: Payer: Self-pay

## 2015-05-10 NOTE — Telephone Encounter (Signed)
Phone call back to Lauren Goodwin and message given to Lauren Goodwin bu myself.  She may take 1 to 2 levsin sl every 4 hours as needed.  I read to the Lauren Goodwin the message reply from Dr. Juanda Chance, that the egd had gone well yesterday and that we needed to wait for bx results.  Lauren Goodwin thanked me for calling her back and said she was feeling some better.  She did not know if it was because she was at work and busy and not thinking about the discomfort or not. maw

## 2015-05-10 NOTE — Telephone Encounter (Signed)
Please call pt back, I read her note, I am sorry she is having chest pain after EGD. Everything went well. I will call her back when we get the biopsies back. In the meantime , SHE MAY TAKE LEVSIN SL.125 MG 1 OR 2 EVERY 4 HRS AS NEEDED FOR CHEST DISCOMFORT.

## 2015-05-10 NOTE — Telephone Encounter (Signed)
  Follow up Call-  Call back number 05/09/2015  Post procedure Call Back phone  # [phone]  Permission to leave phone message Yes     Patient questions:  Do you have a fever, pain , or abdominal swelling? Yes.   Pain Score  4   Have you tolerated food without any problems? No.  Have you been able to return to your normal activities? No.  Do you have any questions about your discharge instructions: Diet   No. Medications  Yes.   Follow up visit  No.  Do you have questions or concerns about your Care? Yes.    Actions: * If pain score is 4 or above: No action needed, pain <4. See below   Dr. Juanda Chance telephone call to pt this am she still c/o chest pain and started having distal esophageal pain yesterday after the procedure.  Pt rating pain 4/10.  Pt asked if that could be from the biopies taken.  I explained bx were taken from her stomach and first part of her duodenum.  Pt asked if she can take the levsin more often than every 4 hour.  Also questioned how long she can take the levsin.  And lastly, can she take the hydrocodon that you had given her with the levsin for the pain.  She said she had a hard time sleeping last night. Pt wants Dr. Juanda Chance to see this note and if she is out of the office, she will wait for Dr. Juanda Chance to answer it.

## 2015-05-10 NOTE — Telephone Encounter (Signed)
I spoke with Pam, CMA on the third floor.  Dr. Juanda Chance is out of the office today.  She will be back in office Friday.  I called the pt back and explained this to her.  I asked if she wanted me to send this note to another md or to wait for Dr. Juanda Chance till tomorrow.  The pt said she wanted to wait for Dr. Juanda Chance, that she was already familiar with her symptoms and she felt that would be best.  I also explained to the pt if her symptoms worsen or decided she needed help to call the office back.  I am off Friday and the pt said she will call the office back if she has not heard a response back by lunch Friday. maw

## 2015-05-14 ENCOUNTER — Encounter: Payer: Self-pay | Admitting: Internal Medicine

## 2015-05-22 ENCOUNTER — Telehealth: Payer: Self-pay | Admitting: Internal Medicine

## 2015-05-22 NOTE — Telephone Encounter (Signed)
She had a "minimal chronic gastritis" on EGD. She can either continue the PPI ( Prilosec) or purchase OTC Ranitidine 150 mg and take it for 4 weeks.

## 2015-05-22 NOTE — Telephone Encounter (Signed)
Spoke with patient and reviewed results. She wants to know if she needs to be doing anything for the chronic gastritis.

## 2015-05-23 NOTE — Telephone Encounter (Signed)
Spoke with pt and she is aware.

## 2016-02-02 IMAGING — RF DG ESOPHAGUS
7 of 11 series · 14 of 24 positions shown · non-contrast
Comparison: None.

CLINICAL DATA: Chest pain unspecified.  Rule out esophageal spasm.

EXAM:
ESOPHOGRAM / BARIUM SWALLOW / BARIUM TABLET STUDY
TECHNIQUE: Combined double contrast and single contrast examination performed
using effervescent crystals, thick barium liquid, and thin barium
liquid. The patient was observed with fluoroscopy swallowing a 13 mm
barium sulphate tablet.
FLUOROSCOPY TIME:  Radiation Exposure Index (as provided by the
fluoroscopic device):
If the device does not provide the exposure index:
Fluoroscopy Time:  1 minutes 8 seconds
Number of Acquired Images:

[Series 1: run · 4 of 10 slices shown (1 of 7)]
[im 1/10]
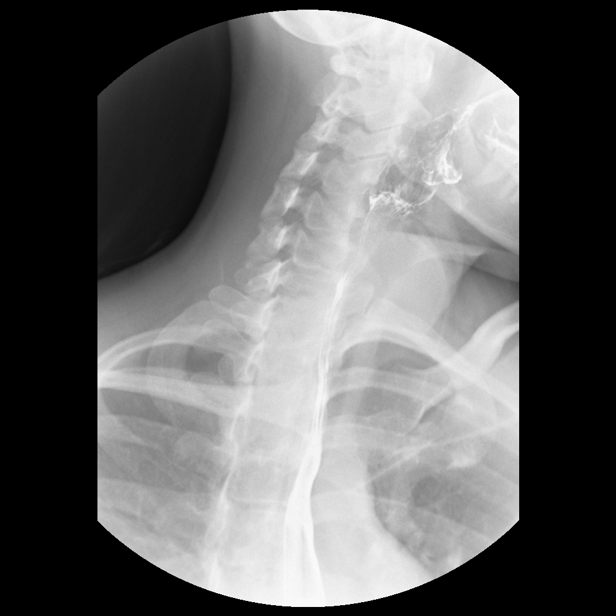
[im 4/10]
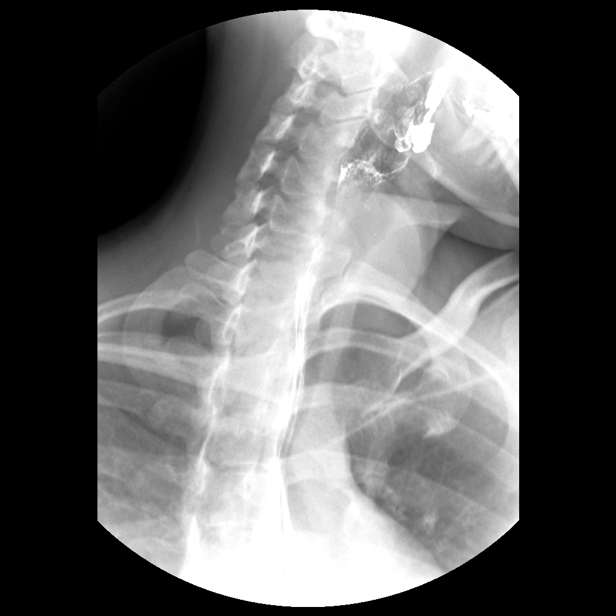
[im 7/10]
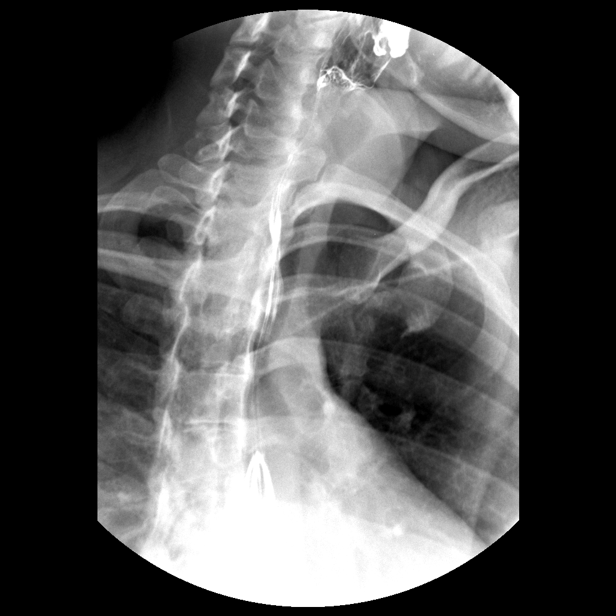
[im 10/10]
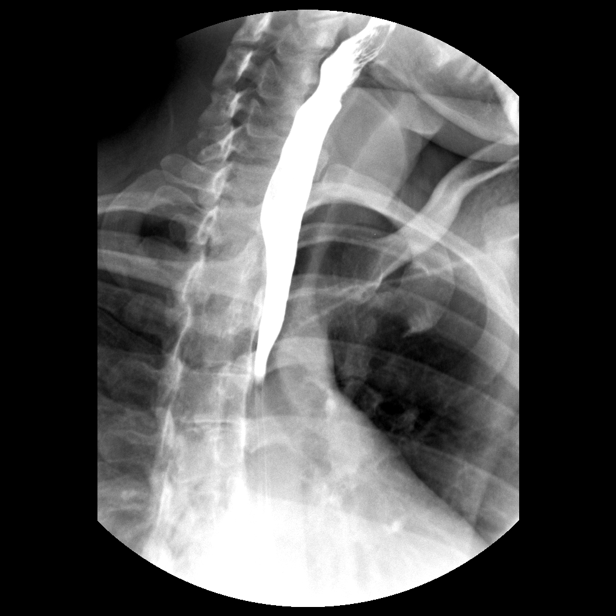

[Series 2: run · 5 of 10 slices shown (2 of 7)]
[im 1/10]
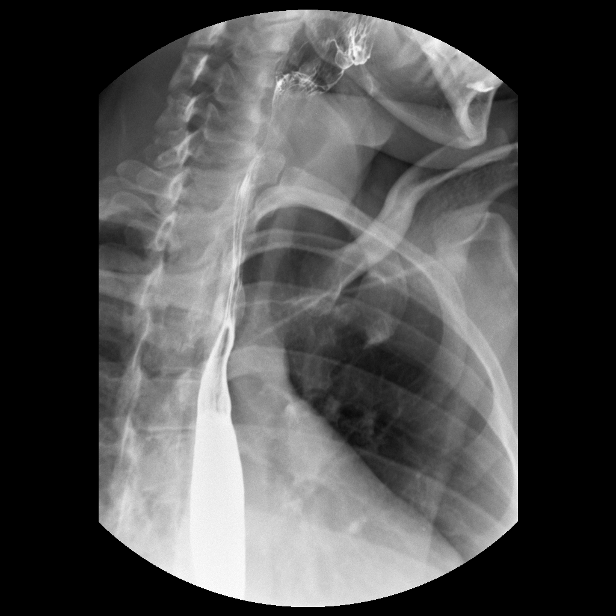
[im 3/10]
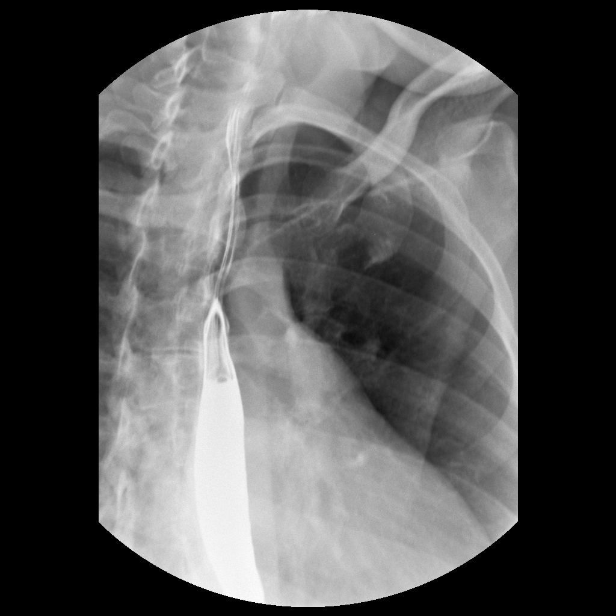
[im 6/10]
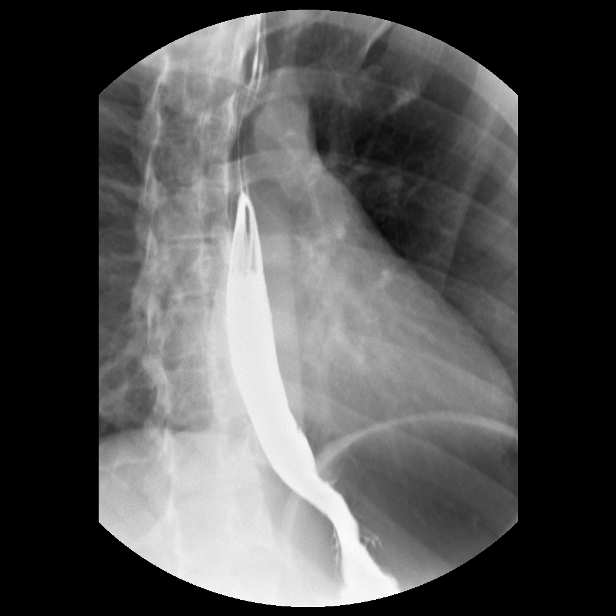
[im 7/10]
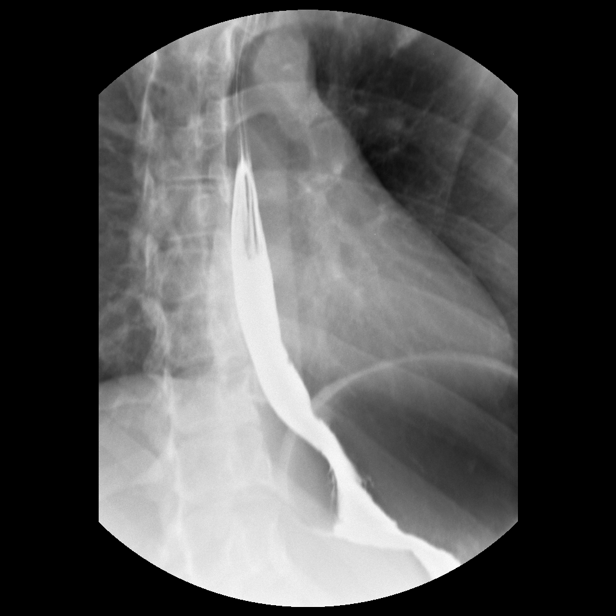
[im 10/10]
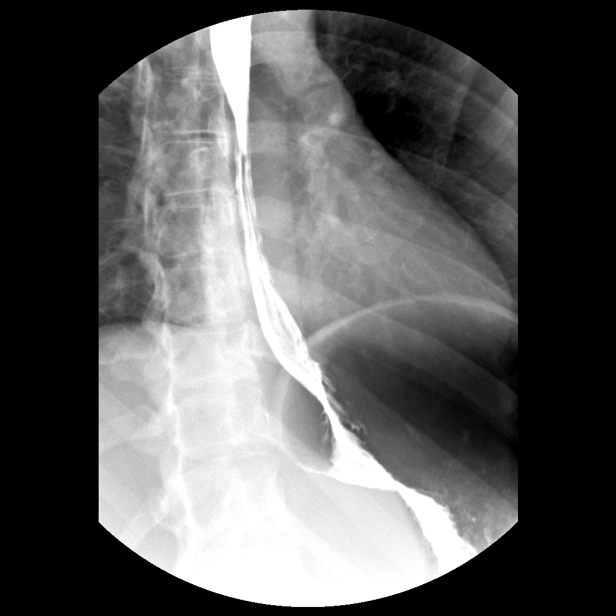

[Series 4: run · 1 of 1 slices shown (3 of 7)]
[im 1/1]
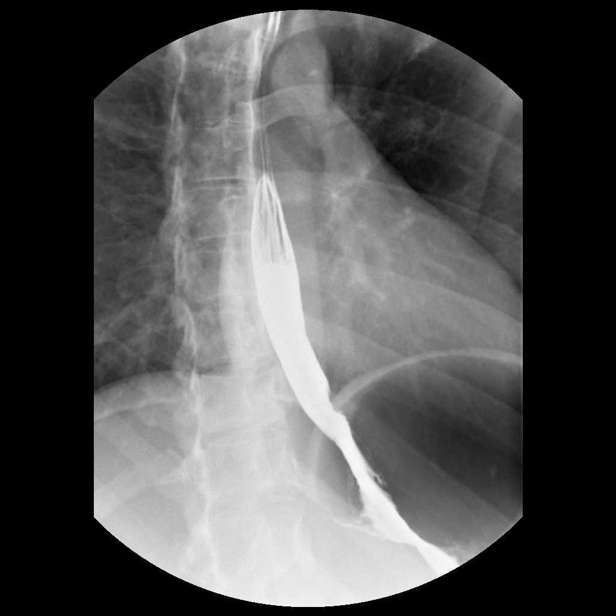

[Series 6: run · 1 of 1 slices shown (4 of 7)]
[im 1/1]
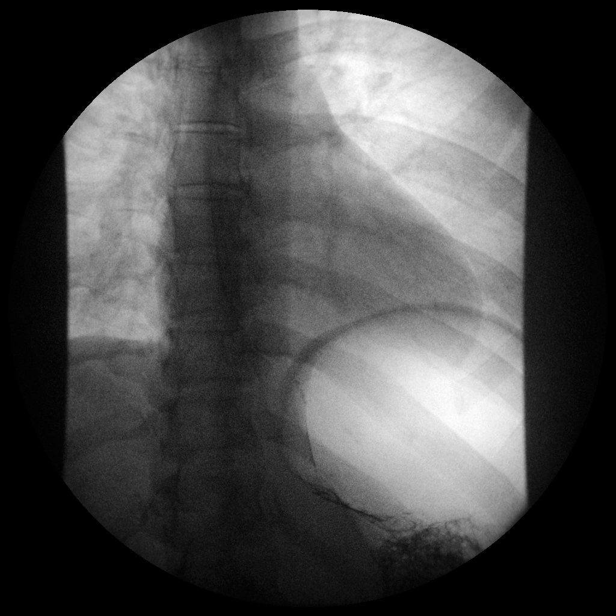

[Series 7: run · 1 of 1 slices shown (5 of 7)]
[im 1/1]
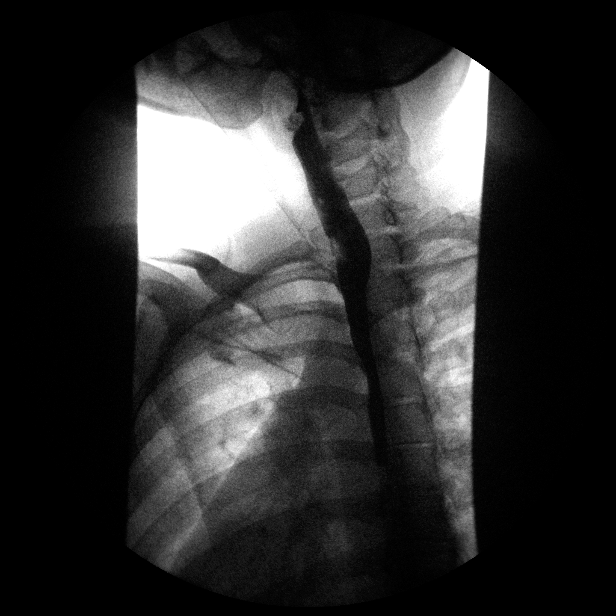

[Series 9: run · 1 of 1 slices shown (6 of 7)]
[im 1/1]
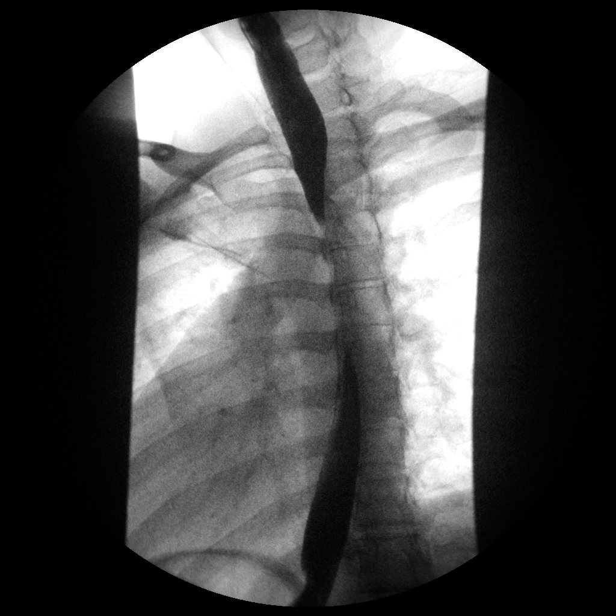

[Series 11: run · 1 of 1 slices shown (7 of 7)]
[im 1/1]
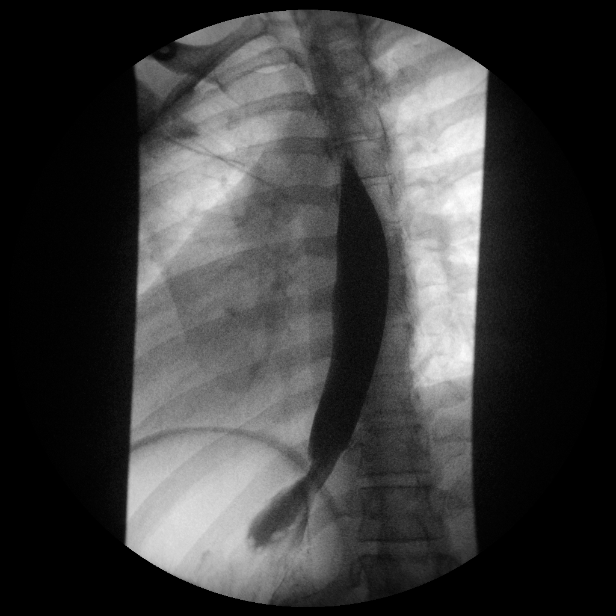

[14 of 24 positions shown; findings below may reference images not displayed]

FINDINGS: Esophageal mucosa and motility normal. No evidence of esophageal
spasm. No stricture or mass.

Negative for hiatal hernia. No gastroesophageal reflux was
demonstrated

Barium tablet passed readily into the stomach without delay.
IMPRESSION: Normal

## 2016-09-25 ENCOUNTER — Ambulatory Visit (INDEPENDENT_AMBULATORY_CARE_PROVIDER_SITE_OTHER): Payer: Managed Care, Other (non HMO) | Admitting: Gastroenterology

## 2016-09-25 ENCOUNTER — Encounter: Payer: Self-pay | Admitting: Gastroenterology

## 2016-09-25 VITALS — BP 100/58 | HR 72 | Ht 66.0 in | Wt 138.1 lb

## 2016-09-25 DIAGNOSIS — R109 Unspecified abdominal pain: Secondary | ICD-10-CM

## 2016-09-25 DIAGNOSIS — R0789 Other chest pain: Secondary | ICD-10-CM | POA: Diagnosis not present

## 2016-09-25 DIAGNOSIS — K581 Irritable bowel syndrome with constipation: Secondary | ICD-10-CM | POA: Diagnosis not present

## 2016-09-25 MED ORDER — HYOSCYAMINE SULFATE 0.125 MG SL SUBL: 0.1250 mg | SUBLINGUAL_TABLET | SUBLINGUAL | 3 refills | Status: DC | PRN

## 2016-09-25 MED ORDER — HYOSCYAMINE SULFATE ER 0.375 MG PO TB12: 0.3750 mg | ORAL_TABLET | Freq: Two times a day (BID) | ORAL | 3 refills | Status: DC

## 2016-09-25 NOTE — Patient Instructions (Signed)
We will refer you to [facility] and contact you with that appointment   Use Benefiber 1 tablespoon three times a day  Use Probiotic Align 1 capsule daily   Follow up as needed

## 2016-09-25 NOTE — Progress Notes (Addendum)
Lauren Goodwin    [id number]    01/12/1981  Primary Care Physician:MCGOUGH,WILLIAM M, MD  Referring Physician: Karleen HampshireWilliam McGough, MD [address] [place], [place] [id number]  Chief complaint:   med refills HPI: [age] yr F with h/o IBS previously followed by Dr Juanda ChanceBrodie, last seen in June 2016 is here for follow up visit.. Patient say currently she has no complaints or GI issues and she is here only for medication refills. She was prescribed Hydrocodone by Dr Juanda ChanceBrodie in 2015 and she wants refill of it.  Patient denies having any significant pain at this time. About 6 or 7 months ago, she was getting frequent headaches and also abdominal cramps which she thinks was associated with increased stress at work. She asked if Ok to take Ibuprofen for headaches or abdominal cramps, as she didn't get an opportunity to ask any physician in the past 15 years and she has been taking it for past 15 years and is concerned about potential side effects. She has intermittent spasms and abdominal cramps well controlled with SL levsin and Levbid prn. Denies any dysphagia, odynophagia, vomiting, abdominal pain, melena or bright red blood per rectum. She has irregular bowel habits with baseline bowel movement once every 2 days and sometimes can go without bowel movement for 4 days associated with bloating and nausea. She usually doesn't eat breakfast, has a light snack for lunch and her main meal of the day is dinner. Reports drinking fluids through out the day.  She has had extensive GI work up with no significant pathology.     Outpatient Encounter Prescriptions as of 09/25/2016  Medication Sig  . HYDROcodone-acetaminophen (NORCO/VICODIN) 5-325 MG per tablet Take 1 tablet by mouth every 6 (six) hours as needed for moderate pain.  . hyoscyamine (LEVBID) 0.375 MG 12 hr tablet Take 1 tablet (0.375 mg total) by mouth 2 (two) times daily.  . hyoscyamine (LEVSIN SL) 0.125 MG SL tablet Place 1 tablet (0.125  mg total) under the tongue every 4 (four) hours as needed.  . hyoscyamine (LEVSIN SL) 0.125 MG SL tablet Place 1 tablet (0.125 mg total) under the tongue every 4 (four) hours as needed.  Marland Kitchen. ibuprofen (ADVIL,MOTRIN) 200 MG tablet Take 200 mg by mouth every 6 (six) hours as needed.  . Multiple Vitamins-Minerals (MULTIVITAMIN WITH MINERALS) tablet Take 1 tablet by mouth daily.  Marland Kitchen. omeprazole (PRILOSEC) 40 MG capsule   . [DISCONTINUED] hyoscyamine (LEVSIN SL) 0.125 MG SL tablet Place 1 tablet (0.125 mg total) under the tongue every 4 (four) hours as needed.   No facility-administered encounter medications on file as of 09/25/2016.     Allergies as of 09/25/2016  . (No Known Allergies)    Past Medical History:  Diagnosis Date  . Colitis, ischemic (HCC)   . Gastroparesis   . Hx of chest pain    non cardiac  . IBS (irritable bowel syndrome)     Past Surgical History:  Procedure Laterality Date  . COLPOSCOPY    . LYMPH NODE DISSECTION    . WISDOM TOOTH EXTRACTION     sedated     Family History  Problem Relation Age of Onset  . Colon cancer Paternal Grandfather   . Colon polyps Neg Hx   . Kidney disease Neg Hx   . Gallbladder disease Neg Hx   . Esophageal cancer Neg Hx   . Stomach cancer Neg Hx  Social History   Social History  . Marital status: Single    Spouse name: N/A  . Number of children: N/A  . Years of education: N/A   Occupational History  . Not on file.   Social History Main Topics  . Smoking status: Former Games developermoker  . Smokeless tobacco: Never Used  . Alcohol use Yes     Comment: occasionally  . Drug use: No  . Sexual activity: Yes    Partners: Male    Birth control/ protection: None   Other Topics Concern  . Not on file   Social History Narrative  . No narrative on file      Review of systems: Review of Systems  Constitutional: Negative for fever and chills.  HENT: Negative.   Eyes: Negative for blurred vision.  Respiratory: Negative for  cough, shortness of breath and wheezing.   Cardiovascular: Negative for chest pain and palpitations.  Gastrointestinal: as per HPI Genitourinary: Negative for dysuria, urgency, frequency and hematuria.  Musculoskeletal: Negative for myalgias, back pain and joint pain.  Skin: Negative for itching and rash.  Neurological: Negative for dizziness, tremors, focal weakness, seizures and loss of consciousness.  Endo/Heme/Allergies: Negative for seasonal allergies.  Psychiatric/Behavioral: Negative for depression, suicidal ideas and hallucinations.  All other systems reviewed and are negative.   Physical Exam: Vitals:   09/25/16 0828  BP: (!) 100/58  Pulse: 72   Body mass index is 22.29 kg/m. Gen:      No acute distress HEENT:  EOMI, sclera anicteric Neck:     No masses; no thyromegaly Lungs:    Clear to auscultation bilaterally; normal respiratory effort CV:         Regular rate and rhythm; no murmurs Abd:      + bowel sounds; soft, non-tender; no palpable masses, no distension Ext:    No edema; adequate peripheral perfusion Skin:      Warm and dry; no rash Neuro: alert and oriented x 3 Psych: normal mood and affect  Data Reviewed:  Reviewed labs, radiology imaging, old records and pertinent past GI work up  04/02/2006 Colonoscopy with random biopsies Normal  08/30/2007 CT abd & pelvis :Distal transverse and descending colonic wall thickening without ?pericolonic abscess 09/30/2007 EGD with small bowel biopsies Normal 10/06/2007 Small bowel video capsule: incomplete study with retained capsule for prolonged period in small bowel 10/21/2007  CT enterography : Negative. No evidence of bowel pathology or other acute findings. Resolution of sigmoid colitis since prior study.  06/20/2011 CT abd & pelvis  No acute intra abdominal or intrapelvic abnormalities identified. 10/29/2007 US abdomen : Normal 05/02/15 Esophagogram : Normal 05/09/2015 EGD unremarkable, H.pylori negative  Assessment  and Plan/Recommendations:  [age] yr F with h/o abdominal cramps and atypical chest pain likely functional here for follow up visit to establish care and request med refills She has had extensive GI work up including endoscopic evaluation with biopsies and multiple imaging studies with significant pathology Patient denies any new or worsening of symptoms at this point of time Start Benefiber 1 tablespoon TID with meals to improve constipation Discussed a trial of probiotic to improve bloating and IBS symptoms Patient is reluctant to try changing diet or any medications as she doesn't think she will be able to tolerate, she was constantly rolling her eyes through out the visit and wasn't interested in trying anything other than Hydrocodone to improve her symptoms Patient repeatedly requested refill of Hydrocodone that was initially presribed by Dr Juanda ChanceBrodie,  She said  that was the main reason she made this appt. I informed patient I will not be giving her any refill on Hydrocodone as is not indicated at this time given lack of any significant pain or GI pathology I offered referral to pain management for evaluation and also Neurology for frequent headaches, patient refused Neurology referral but agreed to see pain management    K. Scherry Ran , MD [phone] Mon-Fri 8a-5p [phone] after 5p, weekends, holidays  CC: Karleen Hampshire, MD

## 2016-09-26 ENCOUNTER — Telehealth: Payer: Self-pay | Admitting: *Deleted

## 2016-09-26 NOTE — Telephone Encounter (Signed)
Faxed over referral form to [facility] today. Waiting for appointment

## 2016-10-23 ENCOUNTER — Encounter: Payer: Self-pay | Admitting: Gastroenterology

## 2016-12-11 NOTE — Telephone Encounter (Signed)
PATIENT WENT TO NEW PATIENT APPOINTMENT BUT CANCELLED FOLLOW UP

## 2020-12-12 LAB — HM PAP SMEAR: HM Pap smear: NEGATIVE

## 2021-06-13 LAB — HEPATITIS C ANTIBODY: HCV Ab: NEGATIVE

## 2021-06-13 LAB — OB RESULTS CONSOLE ABO/RH: RH Type: NEGATIVE

## 2021-06-13 LAB — OB RESULTS CONSOLE HIV ANTIBODY (ROUTINE TESTING): HIV: NONREACTIVE

## 2021-06-13 LAB — OB RESULTS CONSOLE RUBELLA ANTIBODY, IGM: Rubella: IMMUNE

## 2021-06-13 LAB — OB RESULTS CONSOLE GC/CHLAMYDIA
Chlamydia: NEGATIVE
Gonorrhea: NEGATIVE

## 2021-06-13 LAB — OB RESULTS CONSOLE ANTIBODY SCREEN: Antibody Screen: NEGATIVE

## 2021-06-13 LAB — OB RESULTS CONSOLE HEPATITIS B SURFACE ANTIGEN: Hepatitis B Surface Ag: NEGATIVE

## 2021-06-13 LAB — OB RESULTS CONSOLE RPR: RPR: NONREACTIVE

## 2021-11-13 ENCOUNTER — Other Ambulatory Visit: Payer: Self-pay | Admitting: Obstetrics & Gynecology

## 2021-11-13 DIAGNOSIS — O365999 Maternal care for other known or suspected poor fetal growth, unspecified trimester, other fetus: Secondary | ICD-10-CM

## 2021-11-15 ENCOUNTER — Ambulatory Visit (HOSPITAL_BASED_OUTPATIENT_CLINIC_OR_DEPARTMENT_OTHER): Payer: Managed Care, Other (non HMO) | Admitting: Obstetrics

## 2021-11-15 ENCOUNTER — Other Ambulatory Visit: Payer: Self-pay

## 2021-11-15 ENCOUNTER — Encounter: Payer: Self-pay | Admitting: *Deleted

## 2021-11-15 ENCOUNTER — Ambulatory Visit: Payer: Managed Care, Other (non HMO)

## 2021-11-15 ENCOUNTER — Ambulatory Visit (HOSPITAL_BASED_OUTPATIENT_CLINIC_OR_DEPARTMENT_OTHER): Payer: Managed Care, Other (non HMO)

## 2021-11-15 ENCOUNTER — Other Ambulatory Visit: Payer: Self-pay | Admitting: *Deleted

## 2021-11-15 ENCOUNTER — Ambulatory Visit: Payer: Managed Care, Other (non HMO) | Attending: Obstetrics & Gynecology | Admitting: *Deleted

## 2021-11-15 VITALS — BP 116/68 | HR 72

## 2021-11-15 DIAGNOSIS — Z363 Encounter for antenatal screening for malformations: Secondary | ICD-10-CM | POA: Diagnosis not present

## 2021-11-15 DIAGNOSIS — O365930 Maternal care for other known or suspected poor fetal growth, third trimester, not applicable or unspecified: Secondary | ICD-10-CM | POA: Diagnosis not present

## 2021-11-15 DIAGNOSIS — O43193 Other malformation of placenta, third trimester: Secondary | ICD-10-CM | POA: Insufficient documentation

## 2021-11-15 DIAGNOSIS — O09523 Supervision of elderly multigravida, third trimester: Secondary | ICD-10-CM | POA: Insufficient documentation

## 2021-11-15 DIAGNOSIS — Z3A29 29 weeks gestation of pregnancy: Secondary | ICD-10-CM | POA: Insufficient documentation

## 2021-11-15 DIAGNOSIS — O365999 Maternal care for other known or suspected poor fetal growth, unspecified trimester, other fetus: Secondary | ICD-10-CM

## 2021-11-15 DIAGNOSIS — Z6791 Unspecified blood type, Rh negative: Secondary | ICD-10-CM | POA: Insufficient documentation

## 2021-11-15 DIAGNOSIS — O365931 Maternal care for other known or suspected poor fetal growth, third trimester, fetus 1: Secondary | ICD-10-CM

## 2021-11-15 NOTE — Progress Notes (Signed)
MFM Note  Lauren Goodwin was seen for an ultrasound exam as possible IUGR was noted on a recent ultrasound performed in your office.  Her pregnancy has also been complicated by advanced maternal age.  She denies any other problems in her current pregnancy and has screened negative for gestational diabetes.  She had a cell free DNA test earlier in her pregnancy which indicated a low risk for trisomy 7, 29, and 13. A female fetus is predicted.   On today's exam, the overall EFW measures at the 16th percentile for her gestational age, indicating that the fetus is not growth restricted.  There was normal amniotic fluid noted.    Vigorous fetal movements were noted throughout today's exam.    Doppler studies of the umbilical arteries performed today showed a normal S/D ratio of 2.53.  There were no signs of absent or reversed end-diastolic flow.  The views of the fetal anatomy were limited today due to her advanced gestational age.  An umbilical vein varix was noted on today's exam.  The patient was reassured that this is most likely a normal finding and will disappear once the cord is clamped and cut.  The patient was informed that anomalies may be missed due to technical limitations. If the fetus is in a suboptimal position or maternal habitus is increased, visualization of the fetus in the maternal uterus may be impaired.  Due to borderline IUGR, we will continue to follow her with growth ultrasounds throughout her pregnancy.    Weekly fetal testing should be scheduled in your office starting at 32 weeks.  The patient was advised that the goal for her delivery would be at 37 weeks or greater.  We will make delivery recommendations based on the EFW obtained during her future ultrasound exams.    Fetal kick count instructions were reviewed today.    A follow-up growth scan was scheduled in 3 weeks.    The patient stated that all of her questions have been answered.  A total of 30 minutes  was spent counseling and coordinating the care for this patient.  Greater than 50% of the time was spent in direct face-to-face contact.

## 2021-11-17 NOTE — L&D Delivery Note (Signed)
Delivery Note ? ?SVD viable female Apgars 9,9 over intact perineum.  Placenta delivered spontaneously intact with 3VC.and velementous insertion ?Repair with 3-0 Chromic of bilateral labia minora tears with good support and hemostasis noted.  R/V exam confirms. .  ? ?Mother and baby to couplet care and are doing well. ? ?EBL 200cc ? ?Candice Camp, MD  ?

## 2021-11-21 ENCOUNTER — Ambulatory Visit: Payer: Managed Care, Other (non HMO)

## 2021-11-21 ENCOUNTER — Other Ambulatory Visit: Payer: Managed Care, Other (non HMO)

## 2021-12-04 ENCOUNTER — Encounter: Payer: Self-pay | Admitting: *Deleted

## 2021-12-04 ENCOUNTER — Other Ambulatory Visit: Payer: Self-pay | Admitting: *Deleted

## 2021-12-04 ENCOUNTER — Other Ambulatory Visit: Payer: Self-pay

## 2021-12-04 ENCOUNTER — Other Ambulatory Visit: Payer: Self-pay | Admitting: Obstetrics

## 2021-12-04 ENCOUNTER — Ambulatory Visit: Payer: Managed Care, Other (non HMO) | Admitting: *Deleted

## 2021-12-04 ENCOUNTER — Ambulatory Visit: Payer: Managed Care, Other (non HMO) | Attending: Obstetrics

## 2021-12-04 VITALS — BP 115/71 | HR 69

## 2021-12-04 DIAGNOSIS — O365930 Maternal care for other known or suspected poor fetal growth, third trimester, not applicable or unspecified: Secondary | ICD-10-CM | POA: Diagnosis present

## 2021-12-04 DIAGNOSIS — Z3A32 32 weeks gestation of pregnancy: Secondary | ICD-10-CM | POA: Diagnosis not present

## 2021-12-04 DIAGNOSIS — O43193 Other malformation of placenta, third trimester: Secondary | ICD-10-CM | POA: Diagnosis not present

## 2021-12-04 DIAGNOSIS — O365931 Maternal care for other known or suspected poor fetal growth, third trimester, fetus 1: Secondary | ICD-10-CM | POA: Insufficient documentation

## 2021-12-11 ENCOUNTER — Ambulatory Visit: Payer: Managed Care, Other (non HMO) | Admitting: *Deleted

## 2021-12-11 ENCOUNTER — Ambulatory Visit: Payer: Managed Care, Other (non HMO)

## 2021-12-11 ENCOUNTER — Ambulatory Visit: Payer: Managed Care, Other (non HMO) | Attending: Obstetrics and Gynecology

## 2021-12-11 ENCOUNTER — Encounter: Payer: Self-pay | Admitting: *Deleted

## 2021-12-11 ENCOUNTER — Other Ambulatory Visit: Payer: Self-pay

## 2021-12-11 VITALS — BP 199/87 | HR 69

## 2021-12-11 DIAGNOSIS — Z3A33 33 weeks gestation of pregnancy: Secondary | ICD-10-CM | POA: Diagnosis not present

## 2021-12-11 DIAGNOSIS — O43193 Other malformation of placenta, third trimester: Secondary | ICD-10-CM | POA: Diagnosis not present

## 2021-12-11 DIAGNOSIS — O365930 Maternal care for other known or suspected poor fetal growth, third trimester, not applicable or unspecified: Secondary | ICD-10-CM

## 2021-12-12 ENCOUNTER — Ambulatory Visit: Payer: Managed Care, Other (non HMO)

## 2021-12-17 ENCOUNTER — Other Ambulatory Visit: Payer: Self-pay | Admitting: *Deleted

## 2021-12-17 ENCOUNTER — Other Ambulatory Visit: Payer: Self-pay

## 2021-12-17 ENCOUNTER — Ambulatory Visit: Payer: Managed Care, Other (non HMO) | Admitting: *Deleted

## 2021-12-17 ENCOUNTER — Encounter: Payer: Self-pay | Admitting: *Deleted

## 2021-12-17 ENCOUNTER — Ambulatory Visit: Payer: Managed Care, Other (non HMO) | Attending: Obstetrics and Gynecology

## 2021-12-17 VITALS — BP 110/66 | HR 64

## 2021-12-17 DIAGNOSIS — O365930 Maternal care for other known or suspected poor fetal growth, third trimester, not applicable or unspecified: Secondary | ICD-10-CM

## 2021-12-17 DIAGNOSIS — O43193 Other malformation of placenta, third trimester: Secondary | ICD-10-CM | POA: Diagnosis not present

## 2021-12-17 DIAGNOSIS — Z3A34 34 weeks gestation of pregnancy: Secondary | ICD-10-CM

## 2021-12-24 ENCOUNTER — Other Ambulatory Visit: Payer: Self-pay

## 2021-12-24 ENCOUNTER — Encounter: Payer: Self-pay | Admitting: *Deleted

## 2021-12-24 ENCOUNTER — Other Ambulatory Visit: Payer: Self-pay | Admitting: *Deleted

## 2021-12-24 ENCOUNTER — Ambulatory Visit: Payer: Managed Care, Other (non HMO) | Attending: Obstetrics and Gynecology

## 2021-12-24 ENCOUNTER — Ambulatory Visit: Payer: Managed Care, Other (non HMO) | Admitting: *Deleted

## 2021-12-24 VITALS — BP 125/67 | HR 64

## 2021-12-24 DIAGNOSIS — O43193 Other malformation of placenta, third trimester: Secondary | ICD-10-CM

## 2021-12-24 DIAGNOSIS — O09523 Supervision of elderly multigravida, third trimester: Secondary | ICD-10-CM

## 2021-12-24 DIAGNOSIS — Z3A35 35 weeks gestation of pregnancy: Secondary | ICD-10-CM

## 2021-12-24 DIAGNOSIS — O365930 Maternal care for other known or suspected poor fetal growth, third trimester, not applicable or unspecified: Secondary | ICD-10-CM

## 2021-12-24 DIAGNOSIS — O358XX0 Maternal care for other (suspected) fetal abnormality and damage, not applicable or unspecified: Secondary | ICD-10-CM

## 2021-12-31 ENCOUNTER — Encounter: Payer: Self-pay | Admitting: *Deleted

## 2021-12-31 ENCOUNTER — Ambulatory Visit: Payer: Managed Care, Other (non HMO) | Admitting: *Deleted

## 2021-12-31 ENCOUNTER — Ambulatory Visit: Payer: Managed Care, Other (non HMO) | Attending: Obstetrics and Gynecology

## 2021-12-31 ENCOUNTER — Other Ambulatory Visit: Payer: Self-pay

## 2021-12-31 VITALS — BP 130/83 | HR 87

## 2021-12-31 DIAGNOSIS — O358XX0 Maternal care for other (suspected) fetal abnormality and damage, not applicable or unspecified: Secondary | ICD-10-CM

## 2021-12-31 DIAGNOSIS — O09523 Supervision of elderly multigravida, third trimester: Secondary | ICD-10-CM | POA: Diagnosis present

## 2021-12-31 DIAGNOSIS — Z3A36 36 weeks gestation of pregnancy: Secondary | ICD-10-CM

## 2021-12-31 DIAGNOSIS — O43193 Other malformation of placenta, third trimester: Secondary | ICD-10-CM | POA: Diagnosis present

## 2021-12-31 DIAGNOSIS — O365930 Maternal care for other known or suspected poor fetal growth, third trimester, not applicable or unspecified: Secondary | ICD-10-CM

## 2022-01-03 LAB — OB RESULTS CONSOLE GBS: GBS: NEGATIVE

## 2022-01-07 ENCOUNTER — Other Ambulatory Visit: Payer: Self-pay

## 2022-01-07 ENCOUNTER — Ambulatory Visit: Payer: Managed Care, Other (non HMO) | Admitting: *Deleted

## 2022-01-07 ENCOUNTER — Ambulatory Visit: Payer: Managed Care, Other (non HMO) | Attending: Maternal & Fetal Medicine

## 2022-01-07 ENCOUNTER — Encounter: Payer: Self-pay | Admitting: *Deleted

## 2022-01-07 VITALS — BP 111/74 | HR 62

## 2022-01-07 DIAGNOSIS — O358XX0 Maternal care for other (suspected) fetal abnormality and damage, not applicable or unspecified: Secondary | ICD-10-CM | POA: Diagnosis not present

## 2022-01-07 DIAGNOSIS — O365930 Maternal care for other known or suspected poor fetal growth, third trimester, not applicable or unspecified: Secondary | ICD-10-CM | POA: Diagnosis present

## 2022-01-07 DIAGNOSIS — Z3A37 37 weeks gestation of pregnancy: Secondary | ICD-10-CM | POA: Diagnosis not present

## 2022-01-07 DIAGNOSIS — O43193 Other malformation of placenta, third trimester: Secondary | ICD-10-CM

## 2022-01-13 ENCOUNTER — Telehealth (HOSPITAL_COMMUNITY): Payer: Self-pay | Admitting: *Deleted

## 2022-01-13 ENCOUNTER — Encounter (HOSPITAL_COMMUNITY): Payer: Self-pay | Admitting: *Deleted

## 2022-01-13 NOTE — Telephone Encounter (Signed)
Preadmission screen  

## 2022-01-14 ENCOUNTER — Other Ambulatory Visit: Payer: Self-pay

## 2022-01-14 ENCOUNTER — Ambulatory Visit: Payer: Managed Care, Other (non HMO) | Attending: Maternal & Fetal Medicine

## 2022-01-14 ENCOUNTER — Ambulatory Visit: Payer: Managed Care, Other (non HMO) | Admitting: *Deleted

## 2022-01-14 ENCOUNTER — Encounter: Payer: Self-pay | Admitting: *Deleted

## 2022-01-14 VITALS — BP 129/79 | HR 62

## 2022-01-14 DIAGNOSIS — O43193 Other malformation of placenta, third trimester: Secondary | ICD-10-CM | POA: Diagnosis not present

## 2022-01-14 DIAGNOSIS — O09523 Supervision of elderly multigravida, third trimester: Secondary | ICD-10-CM

## 2022-01-14 DIAGNOSIS — O358XX0 Maternal care for other (suspected) fetal abnormality and damage, not applicable or unspecified: Secondary | ICD-10-CM | POA: Diagnosis not present

## 2022-01-14 DIAGNOSIS — Z3A38 38 weeks gestation of pregnancy: Secondary | ICD-10-CM

## 2022-01-14 DIAGNOSIS — O365930 Maternal care for other known or suspected poor fetal growth, third trimester, not applicable or unspecified: Secondary | ICD-10-CM | POA: Insufficient documentation

## 2022-01-22 ENCOUNTER — Encounter (HOSPITAL_COMMUNITY): Payer: Self-pay | Admitting: Obstetrics & Gynecology

## 2022-01-23 ENCOUNTER — Other Ambulatory Visit: Payer: Self-pay

## 2022-01-23 ENCOUNTER — Inpatient Hospital Stay (HOSPITAL_COMMUNITY): Payer: Managed Care, Other (non HMO) | Admitting: Anesthesiology

## 2022-01-23 ENCOUNTER — Encounter (HOSPITAL_COMMUNITY): Payer: Self-pay | Admitting: Obstetrics and Gynecology

## 2022-01-23 ENCOUNTER — Inpatient Hospital Stay (HOSPITAL_COMMUNITY): Payer: Managed Care, Other (non HMO)

## 2022-01-23 ENCOUNTER — Inpatient Hospital Stay (HOSPITAL_COMMUNITY)
Admission: AD | Admit: 2022-01-23 | Discharge: 2022-01-25 | DRG: 807 | Disposition: A | Discharge status: Home / Self Care | Payer: Managed Care, Other (non HMO) | Attending: Obstetrics and Gynecology | Admitting: Obstetrics and Gynecology

## 2022-01-23 DIAGNOSIS — Z3490 Encounter for supervision of normal pregnancy, unspecified, unspecified trimester: Principal | ICD-10-CM | POA: Diagnosis present

## 2022-01-23 DIAGNOSIS — O365930 Maternal care for other known or suspected poor fetal growth, third trimester, not applicable or unspecified: Principal | ICD-10-CM | POA: Diagnosis present

## 2022-01-23 DIAGNOSIS — Z87891 Personal history of nicotine dependence: Secondary | ICD-10-CM | POA: Diagnosis not present

## 2022-01-23 DIAGNOSIS — Z3A39 39 weeks gestation of pregnancy: Secondary | ICD-10-CM

## 2022-01-23 DIAGNOSIS — O43123 Velamentous insertion of umbilical cord, third trimester: Secondary | ICD-10-CM | POA: Diagnosis present

## 2022-01-23 DIAGNOSIS — O26893 Other specified pregnancy related conditions, third trimester: Secondary | ICD-10-CM | POA: Diagnosis present

## 2022-01-23 DIAGNOSIS — Z20822 Contact with and (suspected) exposure to covid-19: Secondary | ICD-10-CM | POA: Diagnosis present

## 2022-01-23 HISTORY — DX: Unspecified abnormal cytological findings in specimens from vagina: R87.629

## 2022-01-23 LAB — CBC
HCT: 35.6 % — ABNORMAL LOW (ref 36.0–46.0)
Hemoglobin: 12.1 g/dL (ref 12.0–15.0)
MCH: 30.6 pg (ref 26.0–34.0)
MCHC: 34 g/dL (ref 30.0–36.0)
MCV: 90.1 fL (ref 80.0–100.0)
Platelets: 162 10*3/uL (ref 150–400)
RBC: 3.95 MIL/uL (ref 3.87–5.11)
RDW: 12.2 % (ref 11.5–15.5)
WBC: 7.1 10*3/uL (ref 4.0–10.5)
nRBC: 0 % (ref 0.0–0.2)

## 2022-01-23 LAB — COMPREHENSIVE METABOLIC PANEL
ALT: 18 U/L (ref 0–44)
AST: 25 U/L (ref 15–41)
Albumin: 2.7 g/dL — ABNORMAL LOW (ref 3.5–5.0)
Alkaline Phosphatase: 114 U/L (ref 38–126)
Anion gap: 9 (ref 5–15)
BUN: 11 mg/dL (ref 6–20)
CO2: 23 mmol/L (ref 22–32)
Calcium: 9 mg/dL (ref 8.9–10.3)
Chloride: 102 mmol/L (ref 98–111)
Creatinine, Ser: 0.59 mg/dL (ref 0.44–1.00)
GFR, Estimated: >60 mL/min (ref >60)
Glucose, Bld: 88 mg/dL (ref 70–99)
Potassium: 3.4 mmol/L — ABNORMAL LOW (ref 3.5–5.1)
Sodium: 134 mmol/L — ABNORMAL LOW (ref 135–145)
Total Bilirubin: 0.4 mg/dL (ref 0.3–1.2)
Total Protein: 6.1 g/dL — ABNORMAL LOW (ref 6.5–8.1)

## 2022-01-23 LAB — TYPE AND SCREEN
ABO/RH(D): O NEG
Antibody Screen: POSITIVE

## 2022-01-23 LAB — RESP PANEL BY RT-PCR (FLU A&B, COVID) ARPGX2
Influenza A by PCR: NEGATIVE
Influenza B by PCR: NEGATIVE
SARS Coronavirus 2 by RT PCR: NEGATIVE

## 2022-01-23 LAB — RPR: RPR Ser Ql: NONREACTIVE

## 2022-01-23 MED ORDER — DIPHENHYDRAMINE HCL 50 MG/ML IJ SOLN: 12.5000 mg | INTRAMUSCULAR | Status: DC | PRN

## 2022-01-23 MED ORDER — OXYTOCIN-SODIUM CHLORIDE 30-0.9 UT/500ML-% IV SOLN
1.0000 m[IU]/min | INTRAVENOUS | Status: DC
  Administered 2022-01-23 (×2): 2 m[IU]/min via INTRAVENOUS
  Administered 2022-01-23: 4 m[IU]/min via INTRAVENOUS
  Filled 2022-01-23: qty 500

## 2022-01-23 MED ORDER — SOD CITRATE-CITRIC ACID 500-334 MG/5ML PO SOLN: 30.0000 mL | ORAL | Status: DC | PRN

## 2022-01-23 MED ORDER — PHENYLEPHRINE 40 MCG/ML (10ML) SYRINGE FOR IV PUSH (FOR BLOOD PRESSURE SUPPORT): 80.0000 ug | PREFILLED_SYRINGE | INTRAVENOUS | Status: DC | PRN

## 2022-01-23 MED ORDER — PHENYLEPHRINE 40 MCG/ML (10ML) SYRINGE FOR IV PUSH (FOR BLOOD PRESSURE SUPPORT)
80.0000 ug | PREFILLED_SYRINGE | INTRAVENOUS | Status: DC | PRN
  Administered 2022-01-23 (×2): 80 ug via INTRAVENOUS
  Filled 2022-01-23: qty 10

## 2022-01-23 MED ORDER — OXYTOCIN-SODIUM CHLORIDE 30-0.9 UT/500ML-% IV SOLN: 2.5000 [IU]/h | INTRAVENOUS | Status: DC

## 2022-01-23 MED ORDER — LACTATED RINGERS IV SOLN
500.0000 mL | INTRAVENOUS | Status: DC | PRN
  Administered 2022-01-23: 10:00:00 500 mL via INTRAVENOUS

## 2022-01-23 MED ORDER — LIDOCAINE HCL (PF) 1 % IJ SOLN
30.0000 mL | INTRAMUSCULAR | Status: DC | PRN
Start: 2022-01-23 — End: 2022-01-24

## 2022-01-23 MED ORDER — TERBUTALINE SULFATE 1 MG/ML IJ SOLN: 0.2500 mg | Freq: Once | INTRAMUSCULAR | Status: DC | PRN

## 2022-01-23 MED ORDER — EPHEDRINE 5 MG/ML INJ: 10.0000 mg | INTRAVENOUS | Status: DC | PRN

## 2022-01-23 MED ORDER — MISOPROSTOL 50MCG HALF TABLET
50.0000 ug | ORAL_TABLET | ORAL | Status: AC
  Administered 2022-01-23 (×2): 50 ug via ORAL
  Filled 2022-01-23 (×2): qty 1

## 2022-01-23 MED ORDER — ACETAMINOPHEN 325 MG PO TABS: 650.0000 mg | ORAL_TABLET | ORAL | Status: DC | PRN

## 2022-01-23 MED ORDER — LACTATED RINGERS IV SOLN: INTRAVENOUS | Status: DC

## 2022-01-23 MED ORDER — OXYTOCIN BOLUS FROM INFUSION: 333.0000 mL | Freq: Once | INTRAVENOUS | Status: DC

## 2022-01-23 MED ORDER — OXYCODONE-ACETAMINOPHEN 5-325 MG PO TABS: 1.0000 | ORAL_TABLET | ORAL | Status: DC | PRN

## 2022-01-23 MED ORDER — LIDOCAINE HCL (PF) 1 % IJ SOLN
INTRAMUSCULAR | Status: DC | PRN
  Administered 2022-01-23 (×2): 4 mL via EPIDURAL

## 2022-01-23 MED ORDER — BUTORPHANOL TARTRATE 1 MG/ML IJ SOLN
1.0000 mg | INTRAMUSCULAR | Status: DC | PRN
  Administered 2022-01-23: 1 mg via INTRAVENOUS
  Filled 2022-01-23: qty 1

## 2022-01-23 MED ORDER — LACTATED RINGERS IV SOLN: 500.0000 mL | Freq: Once | INTRAVENOUS | Status: DC

## 2022-01-23 MED ORDER — FENTANYL CITRATE (PF) 100 MCG/2ML IJ SOLN: 100.0000 ug | INTRAMUSCULAR | Status: DC | PRN

## 2022-01-23 MED ORDER — FENTANYL-BUPIVACAINE-NACL 0.5-0.125-0.9 MG/250ML-% EP SOLN
12.0000 mL/h | EPIDURAL | Status: DC | PRN
  Administered 2022-01-23: 22:00:00 12 mL/h via EPIDURAL
  Filled 2022-01-23: qty 250

## 2022-01-23 MED ORDER — OXYCODONE-ACETAMINOPHEN 5-325 MG PO TABS: 2.0000 | ORAL_TABLET | ORAL | Status: DC | PRN

## 2022-01-23 MED ORDER — ONDANSETRON HCL 4 MG/2ML IJ SOLN
4.0000 mg | Freq: Four times a day (QID) | INTRAMUSCULAR | Status: DC | PRN
  Administered 2022-01-23: 21:00:00 4 mg via INTRAVENOUS
  Filled 2022-01-23: qty 2

## 2022-01-23 NOTE — Anesthesia Preprocedure Evaluation (Signed)
Anesthesia Evaluation  Patient identified by MRN, date of birth, ID band Patient awake    Reviewed: Allergy & Precautions, Patient's Chart, lab work & pertinent test results  History of Anesthesia Complications Negative for: history of anesthetic complications  Airway Mallampati: II  TM Distance: >3 FB Neck ROM: Full    Dental no notable dental hx.    Pulmonary former smoker,    Pulmonary exam normal        Cardiovascular negative cardio ROS Normal cardiovascular exam     Neuro/Psych negative neurological ROS  negative psych ROS   GI/Hepatic negative GI ROS, Neg liver ROS,   Endo/Other  negative endocrine ROS  Renal/GU negative Renal ROS  negative genitourinary   Musculoskeletal negative musculoskeletal ROS (+)   Abdominal   Peds  Hematology negative hematology ROS (+)   Anesthesia Other Findings Day of surgery medications reviewed with patient.  Reproductive/Obstetrics (+) Pregnancy                             Anesthesia Physical Anesthesia Plan  ASA: 2  Anesthesia Plan: Epidural   Post-op Pain Management:    Induction:   PONV Risk Score and Plan: Treatment may vary due to age or medical condition  Airway Management Planned: Natural Airway  Additional Equipment: Fetal Monitoring  Intra-op Plan:   Post-operative Plan:   Informed Consent: I have reviewed the patients History and Physical, chart, labs and discussed the procedure including the risks, benefits and alternatives for the proposed anesthesia with the patient or authorized representative who has indicated his/her understanding and acceptance.       Plan Discussed with:   Anesthesia Plan Comments:         Anesthesia Quick Evaluation  

## 2022-01-23 NOTE — Progress Notes (Signed)
Lauren Goodwin is a 41 y.o. G2P0010 at [redacted]w[redacted]d by LMP admitted for induction of labor due to Elective at term. ? ?Subjective: ? ? ?Objective: ?BP (!) 141/76   Pulse (!) 57   Temp 97.7 ?F (36.5 ?C) (Oral)   Resp 16   Ht 5\' 6"  (1.676 m)   Wt 77.9 kg   LMP 04/16/2021   BMI 27.71 kg/m?  ?No intake/output data recorded. ?No intake/output data recorded. ? ?FHT:  FHR: 135 bpm, variability: moderate,  accelerations:  Present,  decelerations:  Present occasional variable mild.  Previously with recurrent late decels which responded to pit off, and IVF. ?UC:   regular, every 2-5 minutes ?SVE:   Dilation: 1 ?Effacement (%): 30 ?Station: -3 ?Exam by:: Dr. Corinna Goodwin ? ?Labs: ?Lab Results  ?Component Value Date  ? WBC 7.1 01/23/2022  ? HGB 12.1 01/23/2022  ? HCT 35.6 (L) 01/23/2022  ? MCV 90.1 01/23/2022  ? PLT 162 01/23/2022  ? ? ?Assessment / Plan: ? ? ?Labor:  s/p cytotec with minimal change.  Now on pitocin and increasing as fetus tolerates. ?Preeclampsia:  no signs or symptoms of toxicity ?Fetal Wellbeing:  Category I ?Pain Control:  Labor support without medications ?I/D:  n/a ?Anticipated MOD:  NSVD ? ?Still cx ripening. Ex os 1cm but internal closed and very posterior.  Wants to continue with pitocin and not change cytotec ?FHR tolerating pit well at this time.  No more late decels.  Will AROM when able.(Unable at this time) ? ?Lauren Goodwin ?01/23/2022, 4:32 PM ? ? ?

## 2022-01-23 NOTE — H&P (Signed)
Lauren Goodwin is a 41 y.o. female presenting for IOL for elective reasons.  Pregnancy complicated by borderline SGA but last [place] normal.  Normal NIPS.  GBS neg. ?OB History   ? ? Gravida  ?2  ? Para  ?   ? Term  ?   ? Preterm  ?   ? AB  ?1  ? Living  ?0  ?  ? ? SAB  ?1  ? IAB  ?   ? Ectopic  ?   ? Multiple  ?   ? Live Births  ?0  ?   ?  ?  ? ?Past Medical History:  ?Diagnosis Date  ? Colitis, ischemic (HCC)   ? Gastroparesis   ? Hx of chest pain   ? non cardiac  ? IBS (irritable bowel syndrome)   ? Vaginal Pap smear, abnormal   ? ?Past Surgical History:  ?Procedure Laterality Date  ? COLONOSCOPY    ? COLPOSCOPY    ? DRUG INDUCED ENDOSCOPY    ? LYMPH NODE DISSECTION    ? WISDOM TOOTH EXTRACTION    ? sedated   ? ?Family History: family history includes Bladder Cancer in her paternal grandmother; Colon cancer in her paternal grandfather. ?Social History:  reports that she has quit smoking. She has never used smokeless tobacco. She reports that she does not currently use alcohol. She reports that she does not use drugs. ? ? ?  ?Maternal Diabetes: No ?Genetic Screening: Normal ?Maternal Ultrasounds/Referrals: Normal ?Fetal Ultrasounds or other Referrals:  None ?Maternal Substance Abuse:  No ?Significant Maternal Medications:  None ?Significant Maternal Lab Results:  Group B Strep negative ?Other Comments:  None ? ?Review of Systems ?History ?Dilation: 1 ?Effacement (%): 50 ?Station: -3 ?Exam by:: Dr. Rana Snare ?Blood pressure 126/75, pulse (!) 55, temperature 98 ?F (36.7 ?C), height 5\' 6"  (1.676 m), weight 77.9 kg, last menstrual period 04/16/2021. ?Exam ?Physical Exam  ?Vitals and nursing note reviewed. Exam conducted with a chaperone present.  ?Constitutional:   ?   Appearance: Normal appearance.  ?HENT:  ?   Head: Normocephalic.  ?Eyes:  ?   Pupils: Pupils are equal, round, and reactive to light.  ?Cardiovascular:  ?   Rate and Rhythm: Normal rate and regular rhythm.  ?   Pulses: Normal pulses.  ?Abdominal:  ?    General: Abdomen is Gravid, nontender ?Neurological:  ?   Mental Status: She is alert.  ?Prenatal labs: ?ABO, Rh: --/--/O NEG (03/09 0035) ?Antibody: POS (03/09 0035) ?Rubella: Immune (07/28 0000) ?RPR: NON REACTIVE (03/09 0037)  ?HBsAg: Negative (07/28 0000)  ?HIV: Non-reactive (07/28 0000)  ?GBS:    ? ?Assessment/Plan: ?IUP at term ?IOL s/p cytotec.  Unable to AROM due to posterior cx position.  Will begin pitocin and AROM when able ?FHR reassuring  ? ?09-12-1978 ?01/23/2022, 9:56 AM ? ? ? ? ?

## 2022-01-23 NOTE — Progress Notes (Signed)
Notified of high presenting part.  BSUS done and vertex presentation confirmed. ? ?Lauren Goodwin ?

## 2022-01-23 NOTE — Anesthesia Procedure Notes (Signed)
Epidural ?Patient location during procedure: OB ?Start time: 01/23/2022 10:23 PM ?End time: 01/23/2022 10:26 PM ? ?Staffing ?Anesthesiologist: Kaylyn Layer, MD ?Performed: anesthesiologist  ? ?Preanesthetic Checklist ?Completed: patient identified, IV checked, risks and benefits discussed, monitors and equipment checked, pre-op evaluation and timeout performed ? ?Epidural ?Patient position: sitting ?Prep: DuraPrep and site prepped and draped ?Patient monitoring: continuous pulse ox, blood pressure and heart rate ?Approach: midline ?Location: L3-L4 ?Injection technique: LOR air ? ?Needle:  ?Needle type: Tuohy  ?Needle gauge: 17 G ?Needle length: 9 cm ?Needle insertion depth: 4 cm ?Catheter type: closed end flexible ?Catheter size: 19 Gauge ?Catheter at skin depth: 9 cm ?Test dose: negative and Other (1% lidocaine) ? ?Assessment ?Events: blood not aspirated, injection not painful, no injection resistance, no paresthesia and negative IV test ? ?Additional Notes ?Patient identified. Risks, benefits, and alternatives discussed with patient including but not limited to bleeding, infection, nerve damage, paralysis, failed block, incomplete pain control, headache, blood pressure changes, nausea, vomiting, reactions to medication, itching, and postpartum back pain. Confirmed with bedside nurse the patient's most recent platelet count. Confirmed with patient that they are not currently taking any anticoagulation, have any bleeding history, or any family history of bleeding disorders. Patient expressed understanding and wished to proceed. All questions were answered. Sterile technique was used throughout the entire procedure. Please see nursing notes for vital signs.  ? ?Crisp LOR on first pass. Test dose was given through epidural catheter and negative prior to continuing to dose epidural or start infusion. Warning signs of high block given to the patient including shortness of breath, tingling/numbness in hands, complete  motor block, or any concerning symptoms with instructions to call for help. Patient was given instructions on fall risk and not to get out of bed. All questions and concerns addressed with instructions to call with any issues or inadequate analgesia.  Reason for block:procedure for pain ? ? ? ?

## 2022-01-24 ENCOUNTER — Encounter (HOSPITAL_COMMUNITY): Payer: Self-pay | Admitting: Obstetrics and Gynecology

## 2022-01-24 LAB — CBC
HCT: 31.7 % — ABNORMAL LOW (ref 36.0–46.0)
Hemoglobin: 11.2 g/dL — ABNORMAL LOW (ref 12.0–15.0)
MCH: 31.5 pg (ref 26.0–34.0)
MCHC: 35.3 g/dL (ref 30.0–36.0)
MCV: 89 fL (ref 80.0–100.0)
Platelets: 145 10*3/uL — ABNORMAL LOW (ref 150–400)
RBC: 3.56 MIL/uL — ABNORMAL LOW (ref 3.87–5.11)
RDW: 12.2 % (ref 11.5–15.5)
WBC: 12.9 10*3/uL — ABNORMAL HIGH (ref 4.0–10.5)
nRBC: 0 % (ref 0.0–0.2)

## 2022-01-24 MED ORDER — OXYCODONE-ACETAMINOPHEN 5-325 MG PO TABS: 1.0000 | ORAL_TABLET | ORAL | Status: DC | PRN

## 2022-01-24 MED ORDER — ONDANSETRON HCL 4 MG PO TABS: 4.0000 mg | ORAL_TABLET | ORAL | Status: DC | PRN

## 2022-01-24 MED ORDER — SENNOSIDES-DOCUSATE SODIUM 8.6-50 MG PO TABS
2.0000 | ORAL_TABLET | Freq: Every day | ORAL | Status: DC
  Filled 2022-01-24: qty 2

## 2022-01-24 MED ORDER — MEDROXYPROGESTERONE ACETATE 150 MG/ML IM SUSP: 150.0000 mg | INTRAMUSCULAR | Status: DC | PRN

## 2022-01-24 MED ORDER — SIMETHICONE 80 MG PO CHEW
80.0000 mg | CHEWABLE_TABLET | ORAL | Status: DC | PRN
Start: 2022-01-24 — End: 2022-01-25

## 2022-01-24 MED ORDER — DIPHENHYDRAMINE HCL 25 MG PO CAPS: 25.0000 mg | ORAL_CAPSULE | Freq: Four times a day (QID) | ORAL | Status: DC | PRN

## 2022-01-24 MED ORDER — MEASLES, MUMPS & RUBELLA VAC IJ SOLR: 0.5000 mL | Freq: Once | INTRAMUSCULAR | Status: DC

## 2022-01-24 MED ORDER — ZOLPIDEM TARTRATE 5 MG PO TABS: 5.0000 mg | ORAL_TABLET | Freq: Every evening | ORAL | Status: DC | PRN

## 2022-01-24 MED ORDER — COCONUT OIL OIL: 1.0000 "application " | TOPICAL_OIL | Status: DC | PRN

## 2022-01-24 MED ORDER — TETANUS-DIPHTH-ACELL PERTUSSIS 5-2.5-18.5 LF-MCG/0.5 IM SUSY: 0.5000 mL | PREFILLED_SYRINGE | Freq: Once | INTRAMUSCULAR | Status: DC

## 2022-01-24 MED ORDER — WITCH HAZEL-GLYCERIN EX PADS: 1.0000 "application " | MEDICATED_PAD | CUTANEOUS | Status: DC | PRN

## 2022-01-24 MED ORDER — BENZOCAINE-MENTHOL 20-0.5 % EX AERO
1.0000 "application " | INHALATION_SPRAY | CUTANEOUS | Status: DC | PRN
  Administered 2022-01-24: 1 via TOPICAL
  Filled 2022-01-24: qty 56

## 2022-01-24 MED ORDER — IBUPROFEN 600 MG PO TABS
600.0000 mg | ORAL_TABLET | Freq: Four times a day (QID) | ORAL | Status: DC
  Administered 2022-01-24 – 2022-01-25 (×6): 600 mg via ORAL
  Filled 2022-01-24 (×6): qty 1

## 2022-01-24 MED ORDER — ACETAMINOPHEN 325 MG PO TABS
650.0000 mg | ORAL_TABLET | ORAL | Status: DC | PRN
  Administered 2022-01-24: 650 mg via ORAL
  Filled 2022-01-24: qty 2

## 2022-01-24 MED ORDER — PRENATAL MULTIVITAMIN CH
1.0000 | ORAL_TABLET | Freq: Every day | ORAL | Status: DC
  Administered 2022-01-24: 1 via ORAL
  Filled 2022-01-24 (×2): qty 1

## 2022-01-24 MED ORDER — ONDANSETRON HCL 4 MG/2ML IJ SOLN: 4.0000 mg | INTRAMUSCULAR | Status: DC | PRN

## 2022-01-24 MED ORDER — DIBUCAINE (PERIANAL) 1 % EX OINT: 1.0000 "application " | TOPICAL_OINTMENT | CUTANEOUS | Status: DC | PRN

## 2022-01-24 MED ORDER — OXYCODONE-ACETAMINOPHEN 5-325 MG PO TABS: 2.0000 | ORAL_TABLET | ORAL | Status: DC | PRN

## 2022-01-24 NOTE — Discharge Summary (Signed)
? ?  Postpartum Discharge Summary ? ?Date of Service updated 01/25/22 ? ?   ?Patient Name: Lauren Goodwin ?DOB: Jun 18, 1981 ?MRN: [id number] ? ?Date of admission: 01/23/2022 ?Delivery date:01/24/2022  ?Delivering provider: LOWE, DAVID  ?Date of discharge: 01/25/2022 ? ?Admitting diagnosis: Encounter for induction of labor [Z34.90] ?Intrauterine pregnancy: [redacted]w[redacted]d    ?Secondary diagnosis:  Principal Problem: ?  Encounter for induction of labor ? ?Additional problems: Suspected SGA vs IUGR  ?Discharge diagnosis: Term Pregnancy Delivered                                              ?Post partum procedures: None ?Augmentation: AROM, Pitocin, and Cytotec ?Complications: None ? ?Hospital course: Induction of Labor With Vaginal Delivery   ?41y.o. yo G2P1011 at 376w4das admitted to the hospital 01/23/2022 for induction of labor.  Indication for induction:  IUGR suspected and AMA .  Patient had an uncomplicated labor course as follows: ?Membrane Rupture Time/Date: 9:09 PM ,01/23/2022   ?Delivery Method:Vaginal, Spontaneous  ?Episiotomy: None  ?Lacerations:  Labial  ?Details of delivery can be found in separate delivery note.  Patient had a routine postpartum course. Patient is discharged home 01/25/22. ? ?Newborn Data: ?Birth date:01/24/2022  ?Birth time:12:12 AM  ?Gender:Female  ?Living status:Living  ?Apgars:9 ,9  ?Weight:2720 g  ?D/W patient female infant circumcision, risks/benefits reviewed. All questions answered. ? ?Magnesium Sulfate received: No ?BMZ received: No ?Rhophylac:No ?MMR:N/A ?T-DaP:Given prenatally ?Flu: N/A ?Transfusion:No ? ?Physical exam  ?Vitals:  ? 01/24/22 0200 01/24/22 0218 01/24/22 0305 01/24/22 0636  ?BP: 126/82 123/82 121/71 116/88  ?Pulse: 69 65 67 (!) 57  ?Resp: _0 ?Temp: 97.9 ?F (36.6 ?C)  97.9 ?F (36.6 ?C) 98.4 ?F (36.9 ?C)  ?TempSrc: Oral  Oral Oral  ?SpO2: 100% 100% 97% 97%  ?Weight:      ?Height:      ? ?General: alert, cooperative, and no distress ?Lochia: appropriate ?Uterine Fundus:  firm ?Incision: N/A ?DVT Evaluation: No evidence of DVT seen on physical exam. ?Labs: ?Lab Results  ?Component Value Date  ? WBC 12.9 (H) 01/24/2022  ? HGB 11.2 (L) 01/24/2022  ? HCT 31.7 (L) 01/24/2022  ? MCV 89.0 01/24/2022  ? PLT 145 (L) 01/24/2022  ? ?CMP Latest Ref Rng & Units 01/23/2022  ?Glucose 70 - 99 mg/dL 88  ?BUN 6 - 20 mg/dL 11  ?Creatinine 0.44 - 1.00 mg/dL 0.59  ?Sodium 135 - 145 mmol/L 134(L)  ?Potassium 3.5 - 5.1 mmol/L 3.4(L)  ?Chloride 98 - 111 mmol/L 102  ?CO2 22 - 32 mmol/L 23  ?Calcium 8.9 - 10.3 mg/dL 9.0  ?Total Protein 6.5 - 8.1 g/dL 6.1(L)  ?Total Bilirubin 0.3 - 1.2 mg/dL 0.4  ?Alkaline Phos 38 - 126 U/L 114  ?AST 15 - 41 U/L 25  ?ALT 0 - 44 U/L 18  ? ?Edinburgh Score: ?No flowsheet data found. ? ? ? ?After visit meds:  ?Allergies as of 01/25/2022   ?No Known Allergies ?  ? ?  ?Medication List  ?  ? ?STOP taking these medications   ? ?VITAMIN C PO ?  ? ?  ? ?TAKE these medications   ? ?ibuprofen 200 MG tablet ?Commonly known as: ADVIL ?Take 200 mg by mouth every 6 (six) hours as needed. ?  ?omeprazole 40 MG capsule ?Commonly known as: PRILOSEC ?  ?prenatal multivitamin  Tabs tablet ?Take 1 tablet by mouth daily at 12 noon. ?  ? ?  ? ? ? ?Discharge home in stable condition ?Infant Feeding: Bottle and Breast ?Infant Disposition:home with mother ?Discharge instruction: per After Visit Summary and Postpartum booklet. ?Activity: Advance as tolerated. Pelvic rest for 6 weeks.  ?Diet: routine diet ?Anticipated Birth Control: Unsure ?Postpartum Appointment:6 weeks ?Additional Postpartum F/U:  None ?Future Appointments:No future appointments. ?Follow up Visit: ? ? ?  ? ?01/25/2022 ?Tyson Dense, MD ? ? ?

## 2022-01-24 NOTE — Lactation Note (Signed)
This note was copied from a baby's chart. ?Lactation Consultation Note ? ?Patient Name: Lauren Goodwin ?Today's Date: 01/24/2022 ?Reason for consult: Initial assessment;1st time breastfeeding;Primapara;Infant < 6lbs;Term ?Age:40 hours ? ? ?P1 mother whose infant is now 8 hours old.  This is a term baby at 39+4 weeks.  Mother's current feeding preference is breast. ? ?Baby "Lauren Goodwin" was asleep in father's arms when I arrived.  Mother attempted to latch an hour ago, however, he was too sleepy.  Breast feeding basics reviewed with parents.  Encouraged to feed colostrum drops; mother familiar with hand expression.  Mother had many questions and stated, "I know nothing."  Reassurance given and offered to return at the next feeding for assistance.  Mother appreciative and will call for my assistance.  RN updated. ? ? ?Maternal Data ?Has patient been taught Hand Expression?: Yes ?Does the patient have breastfeeding experience prior to this delivery?: No ? ?Feeding ?Mother's Current Feeding Choice: Breast Milk ? ?LATCH Score ?Latch: Repeated attempts needed to sustain latch, nipple held in mouth throughout feeding, stimulation needed to elicit sucking reflex. ? ?Audible Swallowing: None ? ?Type of Nipple: Everted at rest and after stimulation ? ?Comfort (Breast/Nipple): Soft / non-tender ? ?Hold (Positioning): Assistance needed to correctly position infant at breast and maintain latch. ? ?LATCH Score: 6 ? ? ?Lactation Tools Discussed/Used ?  ? ?Interventions ?  ? ?Discharge ?Pump: Personal ([facility]) ?[place] Program: No ? ?Consult Status ?Consult Status: Follow-up ?Date: 01/25/22 ?Follow-up type: In-patient ? ? ? ?Chyane Greer R Mahamadou Weltz ?01/24/2022, 8:23 AM ? ? ? ?

## 2022-01-24 NOTE — Anesthesia Postprocedure Evaluation (Signed)
Anesthesia Post Note ? ?Patient: Lauren Goodwin ? ?Procedure(s) Performed: AN AD HOC LABOR EPIDURAL ? ?  ? ?Patient location during evaluation: Mother Baby ?Anesthesia Type: Epidural ?Level of consciousness: awake and alert ?Pain management: pain level controlled ?Vital Signs Assessment: post-procedure vital signs reviewed and stable ?Respiratory status: spontaneous breathing, nonlabored ventilation and respiratory function stable ?Cardiovascular status: stable ?Postop Assessment: no headache, no backache, epidural receding, no apparent nausea or vomiting, patient able to bend at knees, adequate PO intake and able to ambulate ?Anesthetic complications: no ? ? ?No notable events documented. ? ?Last Vitals:  ?Vitals:  ? 01/24/22 0305 01/24/22 0636  ?BP: 121/71 116/88  ?Pulse: 67 (!) 57  ?Resp: 18 16  ?Temp: 36.6 ?C 36.9 ?C  ?SpO2: 97% 97%  ?  ?Last Pain:  ?Vitals:  ? 01/24/22 0710  ?TempSrc:   ?PainSc: 0-No pain  ? ?Pain Goal: Patients Stated Pain Goal: 3 (01/23/22 2121) ? ?  ?  ?  ?  ?  ?  ?  ? ?Euan Wandler Hristova ? ? ? ? ?

## 2022-01-24 NOTE — Lactation Note (Signed)
This note was copied from a baby's chart. ?Lactation Consultation Note ? ?Patient Name: Lauren Goodwin ?Today's Date: 01/24/2022 ?Reason for consult: Follow-up assessment;Mother's request;Primapara;1st time breastfeeding;Term ?Age:40 hours ? ? ?LC Follow Up Consult: ? ?Mother called for latch assistance. ? ?"[name]" was not showing cues, however, father assisted to awaken him as discussed in previous consult.  Finger fed colostrum drops and attempted to latch.  "[name]" showed no interest in opening his mouth to latch; burped a few times and repeated attempt.  No interest shown and he remained quiet and content.  Placed him STS on mother's chest where he fell asleep. ? ?Reviewed feeding on cue or at least every three hours due to size.  Mother will continue practicing hand expression and will finger feed/spoon feed any EBM she obtains.  Father involved and very supportive.  Parents will call for support as needed. ? ? ?Maternal Data ?Has patient been taught Hand Expression?: Yes ?Does the patient have breastfeeding experience prior to this delivery?: No ? ?Feeding ?Mother's Current Feeding Choice: Breast Milk ? ?LATCH Score ?Latch: Too sleepy or reluctant, no latch achieved, no sucking elicited. ? ?Audible Swallowing: None ? ?Type of Nipple: Everted at rest and after stimulation ? ?Comfort (Breast/Nipple): Soft / non-tender ? ?Hold (Positioning): Assistance needed to correctly position infant at breast and maintain latch. ? ?LATCH Score: 5 ? ? ?Lactation Tools Discussed/Used ?  ? ?Interventions ?Interventions: Breast feeding basics reviewed;Assisted with latch;Breast massage;Skin to skin;Adjust position;Position options;Support pillows;Expressed milk;Education ? ?Discharge ?Pump: Personal ([facility]) ?WIC Program: No ? ?Consult Status ?Consult Status: Follow-up ?Date: 01/25/22 ?Follow-up type: In-patient ? ? ? ?Tauni Sanks R Mariafernanda Hendricksen ?01/24/2022, 10:39 AM ? ? ? ?

## 2022-01-25 NOTE — Lactation Note (Addendum)
This note was copied from a baby's chart. ?Lactation Consultation Note ? ?Patient Name: Lauren Goodwin ?Today's Date: 01/25/2022 ?Reason for consult: Follow-up assessment ?Age:40 hours ? ?Family ready for DC.  Mom states she recently fed for 15-20 minutes although infant was not actively sucking the entire feeding.  When [name] asked about hearing swallows, family was unsure. ? ?Mom states she feels feedings are going well, she reports cramping during feeding and did ask about vit. E on nipples for pain.  LC recommended EBM or coconut oil. ? ?LC offered to observe feeding.  Mom has sore nipples, infant latches and actively sucks but stimulation is needed to elicit swallowing.  Family is able to recognize the few swallows and understand to listen for these when feeding.  ?When baby came off the breast, nipple was slightly compressed.  ? ?Family has electric pump. ?Manual pump provided. ?Recommended feeding with cues, hand expression and feeding back any collected to baby.   ?Encouraged pumping after BF if not hearing swallows and to feed milk to baby using a bottle.  Family has bottles at home. ?OP LC follow up strongly encouraged and mom message sent. ? ? ? ? ?Maternal Data ?Has patient been taught Hand Expression?: Yes ? ?Feeding ?Mother's Current Feeding Choice: Breast Milk ? ?LATCH Score ?Latch: Grasps breast easily, tongue down, lips flanged, rhythmical sucking. ? ?Audible Swallowing: A few with stimulation (a few heard with compression to breast and stimulation to baby) ? ?Type of Nipple: Everted at rest and after stimulation ? ?Comfort (Breast/Nipple): Filling, red/small blisters or bruises, mild/mod discomfort (compression stripes on both nipples, tender to touch, pink) ? ?Hold (Positioning): Assistance needed to correctly position infant at breast and maintain latch. ? ?LATCH Score: 7 ? ? ?Lactation Tools Discussed/Used ?  ? ?Interventions ?Interventions: Breast feeding basics reviewed;Education;LC  Services brochure;Position options;Skin to skin;Breast massage;Hand express ? ?Discharge ?Discharge Education: Engorgement and breast care;Outpatient recommendation;Outpatient Epic message sent ?Pump: Manual (provided before DC) ? ?Consult Status ?Consult Status: Complete ?Date: 01/26/22 ?Follow-up type: In-patient ? ? ? ?[name] ?01/25/2022, 3:44 PM ? ? ? ?

## 2022-01-27 LAB — SURGICAL PATHOLOGY

## 2022-02-04 ENCOUNTER — Telehealth (HOSPITAL_COMMUNITY): Payer: Self-pay | Admitting: *Deleted

## 2022-02-04 NOTE — Telephone Encounter (Signed)
Hospital Discharge Follow-Up Call: ? ?Patient reports that she is well.  She is having mild abdominal pain.  Description is vague.  She says it does not interfere with her activities.  Encouraged her to call her provider if it worsens or does not go away on its own. She has no other concerns about her health.  EPDS today was 0 and she endorses this accurately reflects that she is doing well emotionally. ? ?Patient says that baby is well.  Last week they saw pediatrician twice and met with a lactation consultant.  Per pediatrician, they began supplementing with formula and pediatrician is pleased with weight gain now.  They have a referral to a pediatric ENT to evaluate for tongue tie.  She says that baby will not latch now, but is getting EBM.  She is hopeful that baby may latch again if they get the tongue tie addressed.  Patient reports that baby is now sleeping in a bedtop bassinet.  ABCs of Safe Sleep reviewed. ?

## 2023-08-28 LAB — HM PAP SMEAR: HM Pap smear: NORMAL

## 2024-06-24 ENCOUNTER — Ambulatory Visit: Payer: Self-pay | Admitting: Physician Assistant

## 2024-06-24 ENCOUNTER — Encounter: Payer: Self-pay | Admitting: Physician Assistant

## 2024-06-24 VITALS — BP 122/77 | HR 63 | Temp 98.2°F | Ht 66.0 in | Wt 154.0 lb

## 2024-06-24 DIAGNOSIS — F419 Anxiety disorder, unspecified: Secondary | ICD-10-CM | POA: Diagnosis not present

## 2024-06-24 DIAGNOSIS — Z1231 Encounter for screening mammogram for malignant neoplasm of breast: Secondary | ICD-10-CM | POA: Diagnosis not present

## 2024-06-24 DIAGNOSIS — Z7689 Persons encountering health services in other specified circumstances: Secondary | ICD-10-CM

## 2024-06-26 DIAGNOSIS — F419 Anxiety disorder, unspecified: Secondary | ICD-10-CM | POA: Insufficient documentation

## 2024-06-26 NOTE — Assessment & Plan Note (Addendum)
 Doing well on current regimen. Continue treatment plan. Does not need medication refills today. Follow up in approximately 2 months for annual physical.

## 2024-06-26 NOTE — Progress Notes (Signed)
 New Patient Office Visit  Subjective    Patient ID: Lauren Goodwin, female    DOB: [birth year]  Age: 43 y.o. MRN: [id number]  CC: No chief complaint on file.   HPI Lauren Goodwin presents today to establish care. She reports she is currently taking Wellbutrin and Lexapro for stress management as she has a busy life with 3 children. She states she has just recently started back on Wellbutrin. She feels well and is working on [employer] and exercise. She reports history of IBS previously followed with GI, however, now controlled with diet. She has no additional complaints today aside from establishing care. She is due for her annual physical in October.    Outpatient Encounter Medications as of 06/24/2024  Medication Sig   buPROPion (WELLBUTRIN XL) 300 MG 24 hr tablet Take 300 mg by mouth daily.   escitalopram (LEXAPRO) 10 MG tablet Take 10 mg by mouth daily.   HAILEY 24 FE 1-20 MG-MCG(24) tablet Take 1 tablet by mouth daily.   [DISCONTINUED] ibuprofen  (ADVIL ,MOTRIN ) 200 MG tablet Take 200 mg by mouth every 6 (six) hours as needed. (Patient not taking: Reported on 11/15/2021)   [DISCONTINUED] omeprazole (PRILOSEC) 40 MG capsule    [DISCONTINUED] Prenatal Vit-Fe Fumarate-FA (PRENATAL MULTIVITAMIN) TABS tablet Take 1 tablet by mouth daily at 12 noon.   No facility-administered encounter medications on file as of 06/24/2024.    Past Medical History:  Diagnosis Date   Colitis, ischemic (HCC)    Gastroparesis    Hx of chest pain    non cardiac   IBS (irritable bowel syndrome)    Vaginal Pap smear, abnormal     Past Surgical History:  Procedure Laterality Date   COLONOSCOPY     COLPOSCOPY     DRUG INDUCED ENDOSCOPY     LYMPH NODE DISSECTION     WISDOM TOOTH EXTRACTION     sedated     Family History  Problem Relation Age of Onset   Bladder Cancer Paternal Grandmother    Colon cancer Paternal Grandfather    Colon polyps Neg Hx    Kidney disease Neg Hx    Gallbladder  disease Neg Hx    Esophageal cancer Neg Hx    Stomach cancer Neg Hx     Social History   Socioeconomic History   Marital status: Married    Spouse name: Not on file   Number of children: Not on file   Years of education: Not on file   Highest education level: Not on file  Occupational History   Not on file  Tobacco Use   Smoking status: Former   Smokeless tobacco: Never  Vaping Use   Vaping status: Never Used  Substance and Sexual Activity   Alcohol use: Not Currently   Drug use: No   Sexual activity: Yes    Partners: Male    Birth control/protection: None  Other Topics Concern   Not on file  Social History Narrative   Not on file   Social Drivers of Health   Financial Resource Strain: Not on file  Food Insecurity: Not on file  Transportation Needs: Not on file  Physical Activity: Not on file  Stress: Not on file  Social Connections: Not on file  Intimate Partner Violence: Not on file    Review of Systems  Constitutional:  Negative for chills, fever and malaise/fatigue.  Eyes:  Negative for blurred vision and double vision.  Respiratory:  Negative for cough and shortness of breath.  Cardiovascular:  Negative for chest pain and palpitations.  Musculoskeletal:  Negative for joint pain and myalgias.  Neurological:  Negative for dizziness and headaches.  Psychiatric/Behavioral:  Negative for depression. The patient is not nervous/anxious.         Objective    BP 122/77   Pulse 63   Temp 98.2 F (36.8 C)   Ht 5' 6 (1.676 m)   Wt 154 lb (69.9 kg)   LMP 06/02/2024 (Exact Date)   SpO2 99%   BMI 24.86 kg/m   Physical Exam Constitutional:      Appearance: Normal appearance. She is normal weight.  HENT:     Head: Normocephalic.     Mouth/Throat:     Mouth: Mucous membranes are moist.     Pharynx: Oropharynx is clear.  Eyes:     Extraocular Movements: Extraocular movements intact.     Conjunctiva/sclera: Conjunctivae normal.  Cardiovascular:      Rate and Rhythm: Normal rate and regular rhythm.     Heart sounds: Normal heart sounds. No murmur heard. Pulmonary:     Effort: Pulmonary effort is normal.     Breath sounds: Normal breath sounds. No wheezing, rhonchi or rales.  Skin:    General: Skin is warm and dry.  Neurological:     General: No focal deficit present.     Mental Status: She is alert and oriented to person, place, and time.  Psychiatric:        Mood and Affect: Mood normal.        Behavior: Behavior normal.       Assessment & Plan:  Encounter to establish care  Anxiety Assessment & Plan: Doing well on current regimen. Continue treatment plan. Does not need medication refills today. Follow up in approximately 2 months for annual physical.    Encounter for screening mammogram for malignant neoplasm of breast -     3D Screening Mammogram, Left and Right    Return in about 2 months (around 08/31/2024) for physical .   Charmaine Carline Dura, PA-C

## 2024-07-28 ENCOUNTER — Ambulatory Visit: Payer: Self-pay | Admitting: Family Medicine

## 2024-08-10 ENCOUNTER — Ambulatory Visit (HOSPITAL_COMMUNITY)
Admission: RE | Admit: 2024-08-10 | Discharge: 2024-08-10 | Disposition: A | Discharge status: Home / Self Care | Source: Ambulatory Visit | Attending: Physician Assistant | Admitting: Physician Assistant

## 2024-08-10 DIAGNOSIS — Z1231 Encounter for screening mammogram for malignant neoplasm of breast: Secondary | ICD-10-CM | POA: Insufficient documentation

## 2024-08-15 ENCOUNTER — Ambulatory Visit: Payer: Self-pay | Admitting: Physician Assistant

## 2024-08-29 ENCOUNTER — Other Ambulatory Visit: Payer: Self-pay | Admitting: Physician Assistant

## 2024-08-29 NOTE — Telephone Encounter (Unsigned)
 Copied from CRM [id number]. Topic: Clinical - Medication Refill >> Aug 29, 2024  2:21 PM Antwanette L wrote: Medication: HAILEY 24 FE 1-20 MG-MCG(24) tablet   Has the patient contacted their pharmacy? no   This is the patient's preferred pharmacy:  [facility] [phone] - [place], [place] - [address] AT [facility] [address]  [place] [address] Phone: [phone] Fax: [phone]    Is this the correct pharmacy for this prescription? Yes   Has the prescription been filled recently? Yes. Last refill was on 05/09/24  Is the patient out of the medication? Yes  Has the patient been seen for an appointment in the last year OR does the patient have an upcoming appointment? Yes. Last ov w/ Courtney Grooms PA-C was on 06/24/24 and next appt is 10/22.  Can we respond through MyChart? No. Contact the pt by phone at [phone]  Agent: Please be advised that Rx refills may take up to 3 business days. We ask that you follow-up with your pharmacy.

## 2024-08-30 ENCOUNTER — Other Ambulatory Visit: Payer: Self-pay | Admitting: Physician Assistant

## 2024-08-30 NOTE — Telephone Encounter (Unsigned)
 Copied from CRM [id number]. Topic: Clinical - Prescription Issue >> Aug 30, 2024  9:57 AM Lauren Goodwin wrote: Reason for CRM: Patient calling back regarding refill request made yesterday. Advised patient of 3 day turnaround. Patient is out of birth control pills and does not have her appointment until next week. CB# [phone]

## 2024-08-31 ENCOUNTER — Encounter: Payer: Self-pay | Admitting: Physician Assistant

## 2024-08-31 ENCOUNTER — Other Ambulatory Visit: Payer: Self-pay | Admitting: Physician Assistant

## 2024-08-31 MED ORDER — HAILEY 24 FE 1-20 MG-MCG(24) PO TABS: 1.0000 | ORAL_TABLET | Freq: Every day | ORAL | 11 refills | Status: DC

## 2024-09-07 ENCOUNTER — Ambulatory Visit (INDEPENDENT_AMBULATORY_CARE_PROVIDER_SITE_OTHER): Admitting: Physician Assistant

## 2024-09-07 ENCOUNTER — Encounter: Payer: Self-pay | Admitting: Physician Assistant

## 2024-09-07 VITALS — BP 127/87 | HR 62 | Temp 97.9°F | Ht 66.0 in | Wt 150.6 lb

## 2024-09-07 DIAGNOSIS — Z131 Encounter for screening for diabetes mellitus: Secondary | ICD-10-CM | POA: Diagnosis not present

## 2024-09-07 DIAGNOSIS — Z0000 Encounter for general adult medical examination without abnormal findings: Secondary | ICD-10-CM | POA: Diagnosis not present

## 2024-09-07 DIAGNOSIS — Z13220 Encounter for screening for lipoid disorders: Secondary | ICD-10-CM | POA: Diagnosis not present

## 2024-09-07 MED ORDER — NORETHIN ACE-ETH ESTRAD-FE 1-20 MG-MCG PO TABS: 1.0000 | ORAL_TABLET | Freq: Every day | ORAL | 10 refills | Status: DC

## 2024-09-07 MED ORDER — ESCITALOPRAM OXALATE 10 MG PO TABS: 10.0000 mg | ORAL_TABLET | Freq: Every day | ORAL | 3 refills | Status: AC

## 2024-09-07 MED ORDER — BUPROPION HCL ER (XL) 300 MG PO TB24: 300.0000 mg | ORAL_TABLET | Freq: Every day | ORAL | 3 refills | Status: AC

## 2024-09-07 NOTE — Progress Notes (Signed)
 Complete physical exam  Patient: Lauren Goodwin   DOB: [birth year]   43 y.o. Female  MRN: [id number]  Subjective:    Chief Complaint  Patient presents with   Annual Exam    Pt. Mentioned having a pap smear last year. Everything should be transferred into the system.     Facial Injury    Pt. Stated to have a soreness/pain on the right side of her nostril. Has bleed. Wants a look at it.     Lauren Goodwin is a 43 y.o. female who presents today for a complete physical exam. She reports consuming a general healthy diet, with plenty of vegetables. The patient does not participate in regular exercise at present. However, is active daily with her children. She generally feels fairly well. She reports sleeping well. She does not have additional problems to discuss today.    Most recent fall risk assessment:    06/24/2024    4:21 PM  Fall Risk   Falls in the past year? 0  Number falls in past yr: 0  Injury with Fall? 0  Risk for fall due to : No Fall Risks  Follow up Falls evaluation completed     Most recent depression screenings:    06/24/2024    4:21 PM 06/24/2024    4:13 PM  PHQ 2/9 Scores  PHQ - 2 Score 0 0  PHQ- 9 Score 0 0    Vision:Within last year and Dental: No current dental problems and Receives regular dental care  Patient Care Team: Barbara Ahart, Charmaine, [place] as PCP - General (Physician Assistant)   Outpatient Medications Prior to Visit  Medication Sig   [DISCONTINUED] buPROPion (WELLBUTRIN XL) 300 MG 24 hr tablet Take 300 mg by mouth daily.   [DISCONTINUED] escitalopram (LEXAPRO) 10 MG tablet Take 10 mg by mouth daily.   [DISCONTINUED] norethindrone-ethinyl estradiol-FE (AUROVELA FE 1/20) 1-20 MG-MCG tablet Take 1 tablet by mouth daily.   [DISCONTINUED] HAILEY 24 FE 1-20 MG-MCG(24) tablet Take 1 tablet by mouth daily. (Patient not taking: Reported on 09/07/2024)   No facility-administered medications prior to visit.    Review of Systems  Constitutional:   Negative for chills, fever and malaise/fatigue.  Eyes:  Negative for blurred vision and double vision.  Respiratory:  Negative for cough and shortness of breath.   Cardiovascular:  Negative for chest pain and palpitations.  Musculoskeletal:  Negative for joint pain and myalgias.  Neurological:  Negative for dizziness and headaches.          Objective:     BP 127/87   Pulse 62   Temp 97.9 F (36.6 C)   Ht 5' 6 (1.676 m)   Wt 150 lb 9 oz (68.3 kg)   LMP 07/29/2024   SpO2 100%   BMI 24.30 kg/m   Physical Exam Constitutional:      General: She is not in acute distress.    Appearance: Normal appearance. She is normal weight. She is not ill-appearing.  HENT:     Head: Normocephalic and atraumatic.     Nose: Signs of injury (right lateral nostril) and nasal tenderness present.     Mouth/Throat:     Mouth: Mucous membranes are moist.     Pharynx: Oropharynx is clear.  Eyes:     Extraocular Movements: Extraocular movements intact.     Conjunctiva/sclera: Conjunctivae normal.  Cardiovascular:     Rate and Rhythm: Normal rate and regular rhythm.     Heart sounds: Normal heart sounds.  No murmur heard.    No gallop.  Pulmonary:     Effort: Pulmonary effort is normal.     Breath sounds: Normal breath sounds. No wheezing, rhonchi or rales.  Musculoskeletal:     Right lower leg: No edema.     Left lower leg: No edema.  Skin:    General: Skin is warm and dry.  Neurological:     General: No focal deficit present.     Mental Status: She is alert and oriented to person, place, and time.  Psychiatric:        Mood and Affect: Mood normal.        Behavior: Behavior normal.         Assessment & Plan:    Routine Health Maintenance and Physical Exam  Health Maintenance  Topic Date Due   Hepatitis B Vaccine (1 of 3 - 19+ 3-dose series) Never done   HPV Vaccine (1 - 3-dose SCDM series) Never done   COVID-19 Vaccine (1 - 2025-26 season) Never done   Flu Shot  02/14/2025*    Breast Cancer Screening  08/10/2026   Pap with HPV screening  08/27/2026   DTaP/Tdap/Td vaccine (2 - Td or Tdap) 10/30/2031   Hepatitis C Screening  Completed   HIV Screening  Completed   Pneumococcal Vaccine  Aged Out   Meningitis B Vaccine  Aged Out  *Topic was postponed. The date shown is not the original due date.    Discussed health benefits of physical activity, and encouraged her to engage in regular exercise appropriate for her age and condition.  Problem List Items Addressed This Visit   None Visit Diagnoses       Annual visit for general adult medical examination without abnormal findings    -  Primary   Relevant Orders   CMP14+EGFR   CBC with Differential/Platelet     Screening for lipid disorders       Relevant Orders   Lipid panel     Screening for diabetes mellitus (DM)       Relevant Orders   HgB A1c      Return in about 1 year (around 09/07/2025) for phys with Pap .  Safety measures discussed.  Immunizations reviewed: Defers flu vaccine today Diet and exercise/ lifestyle modifications discussed:  Recommend 150 minutes per week of exercise such as walking. Recommend lots of fresh produce to include fruits, vegetables, beans, healthy fats such as avocado, nuts, seeds, and 3-6 ounces of protein at each meal.  Avoid fried foods and fast food. Limit alcohol consumption: no more than one drink per day for women and 2 drinks per day for men.  Stress management discussed. Routine vision and dental screening discussed: recommend dentist every 6 months, gets vision checked every 1-2 years.  Health maintenance: mammogram and Pap up to date. Next Pap due in 2026. Patient undergoes Paps every other year. Discussed colon cancer screening at age 53.  Questions answered.       Charmaine Mystery Schrupp, PA-C

## 2024-09-08 ENCOUNTER — Ambulatory Visit: Payer: Self-pay | Admitting: Physician Assistant

## 2024-09-08 LAB — CMP14+EGFR
ALT: 24 IU/L (ref 0–32)
AST: 24 IU/L (ref 0–40)
Albumin: 4.3 g/dL (ref 3.9–4.9)
Alkaline Phosphatase: 66 IU/L (ref 41–116)
BUN/Creatinine Ratio: 12 (ref 9–23)
BUN: 9 mg/dL (ref 6–24)
Bilirubin Total: 0.3 mg/dL (ref 0.0–1.2)
CO2: 25 mmol/L (ref 20–29)
Calcium: 9.2 mg/dL (ref 8.7–10.2)
Chloride: 99 mmol/L (ref 96–106)
Creatinine, Ser: 0.75 mg/dL (ref 0.57–1.00)
Globulin, Total: 3.1 g/dL (ref 1.5–4.5)
Glucose: 95 mg/dL (ref 70–99)
Potassium: 4.6 mmol/L (ref 3.5–5.2)
Sodium: 137 mmol/L (ref 134–144)
Total Protein: 7.4 g/dL (ref 6.0–8.5)
eGFR: 102 mL/min/1.73 (ref >59)

## 2024-09-08 LAB — CBC WITH DIFFERENTIAL/PLATELET
Basophils Absolute: 0.1 x10E3/uL (ref 0.0–0.2)
Basos: 1 %
EOS (ABSOLUTE): 0.1 x10E3/uL (ref 0.0–0.4)
Eos: 2 %
Hematocrit: 38.8 % (ref 34.0–46.6)
Hemoglobin: 12.4 g/dL (ref 11.1–15.9)
Immature Grans (Abs): 0 x10E3/uL (ref 0.0–0.1)
Immature Granulocytes: 0 %
Lymphocytes Absolute: 1.8 x10E3/uL (ref 0.7–3.1)
Lymphs: 33 %
MCH: 29.4 pg (ref 26.6–33.0)
MCHC: 32 g/dL (ref 31.5–35.7)
MCV: 92 fL (ref 79–97)
Monocytes Absolute: 0.4 x10E3/uL (ref 0.1–0.9)
Monocytes: 8 %
Neutrophils Absolute: 2.9 x10E3/uL (ref 1.4–7.0)
Neutrophils: 56 %
Platelets: 287 x10E3/uL (ref 150–450)
RBC: 4.22 x10E6/uL (ref 3.77–5.28)
RDW: 11.4 % — ABNORMAL LOW (ref 11.7–15.4)
WBC: 5.3 x10E3/uL (ref 3.4–10.8)

## 2024-09-08 LAB — LIPID PANEL
Chol/HDL Ratio: 3.1 ratio (ref 0.0–4.4)
Cholesterol, Total: 200 mg/dL — ABNORMAL HIGH (ref 100–199)
HDL: 65 mg/dL (ref >39)
LDL Chol Calc (NIH): 119 mg/dL — ABNORMAL HIGH (ref 0–99)
Triglycerides: 92 mg/dL (ref 0–149)
VLDL Cholesterol Cal: 16 mg/dL (ref 5–40)

## 2024-09-08 LAB — HEMOGLOBIN A1C
Est. average glucose Bld gHb Est-mCnc: 108 mg/dL
Hgb A1c MFr Bld: 5.4 % (ref 4.8–5.6)

## 2024-09-14 ENCOUNTER — Encounter: Payer: Self-pay | Admitting: Physician Assistant

## 2024-09-14 MED ORDER — NORETHIN ACE-ETH ESTRAD-FE 1-20 MG-MCG PO TABS: 1.0000 | ORAL_TABLET | Freq: Every day | ORAL | 1 refills | Status: AC

## 2024-09-19 ENCOUNTER — Encounter: Payer: Self-pay | Admitting: Physician Assistant

## 2024-09-20 ENCOUNTER — Other Ambulatory Visit: Payer: Self-pay | Admitting: Physician Assistant

## 2024-09-20 MED ORDER — FLUCONAZOLE 150 MG PO TABS: ORAL_TABLET | ORAL | 0 refills | Status: AC
